# Patient Record
Sex: Female | Born: 1990 | Race: Black or African American | Hispanic: No | Marital: Single | Smoking: Never smoker
Health system: Southern US, Community
[De-identification: ages and names within clinical notes are randomized; demographics above are authoritative.]

## PROBLEM LIST (undated history)

## (undated) ENCOUNTER — Inpatient Hospital Stay: Payer: Self-pay

## (undated) DIAGNOSIS — D649 Anemia, unspecified: Secondary | ICD-10-CM

## (undated) DIAGNOSIS — Q780 Osteogenesis imperfecta: Secondary | ICD-10-CM

## (undated) DIAGNOSIS — D259 Leiomyoma of uterus, unspecified: Secondary | ICD-10-CM

## (undated) DIAGNOSIS — S42302A Unspecified fracture of shaft of humerus, left arm, initial encounter for closed fracture: Secondary | ICD-10-CM

## (undated) DIAGNOSIS — D509 Iron deficiency anemia, unspecified: Secondary | ICD-10-CM

## (undated) DIAGNOSIS — I10 Essential (primary) hypertension: Secondary | ICD-10-CM

## (undated) HISTORY — PX: OTHER SURGICAL HISTORY: SHX169

---
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
## 2011-05-12 ENCOUNTER — Emergency Department: Payer: Self-pay | Admitting: Emergency Medicine

## 2011-05-23 ENCOUNTER — Emergency Department: Payer: Self-pay | Admitting: Emergency Medicine

## 2011-12-24 ENCOUNTER — Emergency Department (HOSPITAL_COMMUNITY)
Admission: EM | Admit: 2011-12-24 | Discharge: 2011-12-24 | Disposition: A | Discharge status: Home / Self Care | Payer: Self-pay | Attending: Emergency Medicine | Admitting: Emergency Medicine

## 2011-12-24 ENCOUNTER — Encounter (HOSPITAL_COMMUNITY): Payer: Self-pay | Admitting: *Deleted

## 2011-12-24 DIAGNOSIS — N72 Inflammatory disease of cervix uteri: Secondary | ICD-10-CM | POA: Insufficient documentation

## 2011-12-24 DIAGNOSIS — N949 Unspecified condition associated with female genital organs and menstrual cycle: Secondary | ICD-10-CM | POA: Insufficient documentation

## 2011-12-24 DIAGNOSIS — N938 Other specified abnormal uterine and vaginal bleeding: Secondary | ICD-10-CM | POA: Insufficient documentation

## 2011-12-24 DIAGNOSIS — N898 Other specified noninflammatory disorders of vagina: Secondary | ICD-10-CM | POA: Insufficient documentation

## 2011-12-24 DIAGNOSIS — R1032 Left lower quadrant pain: Secondary | ICD-10-CM | POA: Insufficient documentation

## 2011-12-24 HISTORY — DX: Osteogenesis imperfecta: Q78.0

## 2011-12-24 HISTORY — DX: Anemia, unspecified: D64.9

## 2011-12-24 LAB — URINALYSIS, ROUTINE W REFLEX MICROSCOPIC
Ketones, ur: NEGATIVE mg/dL
Protein, ur: NEGATIVE mg/dL
Urobilinogen, UA: 1 mg/dL (ref 0.0–1.0)

## 2011-12-24 LAB — WET PREP, GENITAL
Clue Cells Wet Prep HPF POC: NONE SEEN
Trich, Wet Prep: NONE SEEN
Yeast Wet Prep HPF POC: NONE SEEN

## 2011-12-24 LAB — URINE MICROSCOPIC-ADD ON

## 2011-12-24 LAB — PREGNANCY, URINE: Preg Test, Ur: NEGATIVE

## 2011-12-24 MED ORDER — CEFTRIAXONE SODIUM 250 MG IJ SOLR
250.0000 mg | Freq: Once | INTRAMUSCULAR | Status: AC
  Administered 2011-12-24: 250 mg via INTRAMUSCULAR
  Filled 2011-12-24: qty 250

## 2011-12-24 MED ORDER — AZITHROMYCIN 250 MG PO TABS
1000.0000 mg | ORAL_TABLET | Freq: Every day | ORAL | Status: DC
  Administered 2011-12-24: 1000 mg via ORAL
  Filled 2011-12-24: qty 4

## 2011-12-24 NOTE — ED Provider Notes (Signed)
Medical screening examination/treatment/procedure(s) were performed by non-physician practitioner and as supervising physician I was immediately available for consultation/collaboration.  Gerhard Munch, MD 12/24/11 2333

## 2011-12-24 NOTE — Discharge Instructions (Signed)
There is a chance you may have a vaginal/cervical infection. You were treated for this infection today. Make sure to avoid intercourse for at least 5 days. You will be called with results of the cultures, and if abnormal, make sure your partner is treated as well. Follow up with primary care doctor or gynecology for recheck in 1 week to make sure you are improving or if continue to bleed.   Abnormal Uterine Bleeding Abnormal uterine bleeding can have many causes. Some cases are simply treated, while others are more serious. There are several kinds of bleeding that is considered abnormal, including:  Bleeding between periods.   Bleeding after sexual intercourse.   Spotting anytime in the menstrual cycle.   Bleeding heavier or more than normal.   Bleeding after menopause.  CAUSES  There are many causes of abnormal uterine bleeding. It can be present in teenagers, pregnant women, women during their reproductive years, and women who have reached menopause. Your caregiver will look for the more common causes depending on your age, signs, symptoms and your particular circumstance. Most cases are not serious and can be treated. Even the more serious causes, like cancer of the female organs, can be treated adequately if found in the early stages. That is why all types of bleeding should be evaluated and treated as soon as possible. DIAGNOSIS  Diagnosing the cause may take several kinds of tests. Your caregiver may:  Take a complete history of the type of bleeding.   Perform a complete physical exam and Pap smear.   Take an ultrasound on the abdomen showing a picture of the female organs and the pelvis.   Inject dye into the uterus and Fallopian tubes and X-ray them (hysterosalpingogram).   Place fluid in the uterus and do an ultrasound (sonohysterogrqphy).   Take a CT scan to examine the female organs and pelvis.   Take an MRI to examine the female organs and pelvis. There is no X-ray involved  with this procedure.   Look inside the uterus with a telescope that has a light at the end (hysteroscopy).   Scrap the inside of the uterus to get tissue to examine (Dilatation and Curettage, D&C).   Look into the pelvis with a telescope that has a light at the end (laparoscopy). This is done through a very small cut (incision) in the abdomen.  TREATMENT  Treatment will depend on the cause of the abnormal bleeding. It can include:  Doing nothing to allow the problem to take care of itself over time.   Hormone treatment.   Birth control pills.   Treating the medical condition causing the problem.   Laparoscopy.   Major or minor surgery   Destroying the lining of the uterus with electrical currant, laser, freezing or heat (uterine ablation).  HOME CARE INSTRUCTIONS   Follow your caregiver's recommendation on how to treat your problem.   See your caregiver if you missed a menstrual period and think you may be pregnant.   If you are bleeding heavily, count the number of pads/tampons you use and how often you have to change them. Tell this to your caregiver.   Avoid sexual intercourse until the problem is controlled.  SEEK MEDICAL CARE IF:   You have any kind of abnormal bleeding mentioned above.   You feel dizzy at times.   You are 21 years old and have not had a menstrual period yet.  SEEK IMMEDIATE MEDICAL CARE IF:   You pass out.  You are changing pads/tampons every 15 to 30 minutes.   You have belly (abdominal) pain.   You have a temperature of 100 F (37.8 C) or higher.   You become sweaty or weak.   You are passing large blood clots from the vagina.   You start to feel sick to your stomach (nauseous) and throw up (vomit).  Document Released: 08/16/2005 Document Revised: 08/05/2011 Document Reviewed: 01/09/2009 [facility] Patient Information 2012 [place], [place].

## 2011-12-24 NOTE — ED Provider Notes (Signed)
History     CSN: [id number]  Arrival date & time 12/24/11  1647   First MD Initiated Contact with Patient 12/24/11 1747      Chief Complaint  Patient presents with  . Vaginal Bleeding    (Consider location/radiation/quality/duration/timing/severity/associated sxs/prior treatment) Patient is a 21 y.o. female presenting with vaginal bleeding. The history is provided by the patient.  Vaginal Bleeding This is a new problem. The current episode started 1 to 4 weeks ago. The problem occurs constantly. The problem has been unchanged. Pertinent negatives include no abdominal pain, chills, fever, nausea, vomiting or weakness.  PT states she started her period 2 wks ago, at her normal time. States she normally has periods monthly and it lasts about 3 days, states this time she is still on it. States at times it is milder then at others.  States mild discomfort to the left lower abdomen, but states "it does not hurt." Denies dysuria, denies fever, chills, malaise. Pt is sexually actove.   Past Medical History  Diagnosis Date  . Anemia   . Osteogenesis imperfecta     Type I    History reviewed. No pertinent past surgical history.  No family history on file.  History  Substance Use Topics  . Smoking status: Never Smoker   . Smokeless tobacco: Not on file  . Alcohol Use: Yes     socially    OB History    Grav Para Term Preterm Abortions TAB SAB Ect Mult Living                  Review of Systems  Constitutional: Negative for fever and chills.  Respiratory: Negative.   Cardiovascular: Negative.   Gastrointestinal: Negative for nausea, vomiting and abdominal pain.  Genitourinary: Positive for vaginal bleeding. Negative for dysuria, urgency, hematuria, flank pain, vaginal pain and pelvic pain.  Musculoskeletal: Negative for back pain.  Skin: Negative.   Neurological: Negative for dizziness, weakness and light-headedness.    Allergies  Review of patient's allergies indicates no  known allergies.  Home Medications  No current outpatient prescriptions on file.  BP 133/81  Pulse 103  Temp(Src) 99.6 F (37.6 C) (Oral)  Resp 18  Wt 125 lb (56.7 kg)  SpO2 100%  LMP 12/10/2011  Physical Exam  Nursing note and vitals reviewed. Constitutional: She is oriented to person, place, and time. She appears well-developed and well-nourished. No distress.  HENT:  Head: Normocephalic.  Eyes:       Blue sclera  Neck: Neck supple.  Cardiovascular: Normal rate and regular rhythm.   Pulmonary/Chest: Effort normal and breath sounds normal. No respiratory distress. She has no wheezes. She has no rales.  Abdominal: Soft. Bowel sounds are normal. She exhibits no distension. There is no tenderness.  Genitourinary:       Yellow cervical discharge, CMT, normal uterus and adnexa with no tenderness. Normal external genetalia  Musculoskeletal: Normal range of motion.  Neurological: She is alert and oriented to person, place, and time.  Skin: Skin is warm and dry.  Psychiatric: She has a normal mood and affect.    ED Course  Procedures (including critical care time)  Pt with lower abdominal discofort, bleeding for 2 wks. Will get UA, pelvic exam.   Results for orders placed during the hospital encounter of 12/24/11  URINALYSIS, ROUTINE W REFLEX MICROSCOPIC      Component Value Range   Color, Urine YELLOW  YELLOW    APPearance CLOUDY (*) CLEAR    Specific  Gravity, Urine 1.018  1.005 - 1.030    pH 7.0  5.0 - 8.0    Glucose, UA NEGATIVE  NEGATIVE (mg/dL)   Hgb urine dipstick NEGATIVE  NEGATIVE    Bilirubin Urine NEGATIVE  NEGATIVE    Ketones, ur NEGATIVE  NEGATIVE (mg/dL)   Protein, ur NEGATIVE  NEGATIVE (mg/dL)   Urobilinogen, UA 1.0  0.0 - 1.0 (mg/dL)   Nitrite NEGATIVE  NEGATIVE    Leukocytes, UA SMALL (*) NEGATIVE   PREGNANCY, URINE      Component Value Range   Preg Test, Ur NEGATIVE  NEGATIVE   WET PREP, GENITAL      Component Value Range   Yeast Wet Prep HPF POC  NONE SEEN  NONE SEEN    Trich, Wet Prep NONE SEEN  NONE SEEN    Clue Cells Wet Prep HPF POC NONE SEEN  NONE SEEN    WBC, Wet Prep HPF POC MANY (*) NONE SEEN   URINE MICROSCOPIC-ADD ON      Component Value Range   Squamous Epithelial / LPF RARE  RARE    WBC, UA 7-10  <3 (WBC/hpf)   Bacteria, UA RARE  RARE    8:34 PM Many WBCs on wet prep. Will cover with cervicitis given exam findings. Rocephin and zithromax ordered. Will d/c home with follow up with GYN. No bleeding on exam. VS normal. Pt stable for discharge.      1. Cervicitis   2. Dysfunctional uterine bleeding       MDM          Lottie Mussel, PA 12/24/11 2035

## 2011-12-24 NOTE — ED Notes (Signed)
Pt states "sometimes my period has been heavy, sometimes not, has been like really strong smelling, has been going on for 2 wks, my stomach also hurts" pt indicates on the left side

## 2011-12-27 LAB — GC/CHLAMYDIA PROBE AMP, GENITAL
Chlamydia, DNA Probe: POSITIVE — AB
GC Probe Amp, Genital: POSITIVE — AB

## 2012-01-04 NOTE — ED Notes (Signed)
Pt returned call from letter she received and identity was verified x 2.  Pt then given results and instructions.  Pt was treated appropriately in ED so no further treatment needed.

## 2012-03-06 ENCOUNTER — Other Ambulatory Visit: Payer: Self-pay | Admitting: Orthopedic Surgery

## 2012-05-29 ENCOUNTER — Emergency Department: Payer: Self-pay | Admitting: Emergency Medicine

## 2013-08-29 IMAGING — CR LEFT MIDDLE FINGER 2+V
1 series · 3 of 3 positions shown · non-contrast
Comparison: none

REASON FOR EXAM: PAIN, H/O OSTEOGENESIS IMPERFECTA
COMMENTS:   May transport without cardiac monitor

[Series 1: x finger pa left · 0.14mm/px · 3 of 3 slices shown]
[im 1/3]
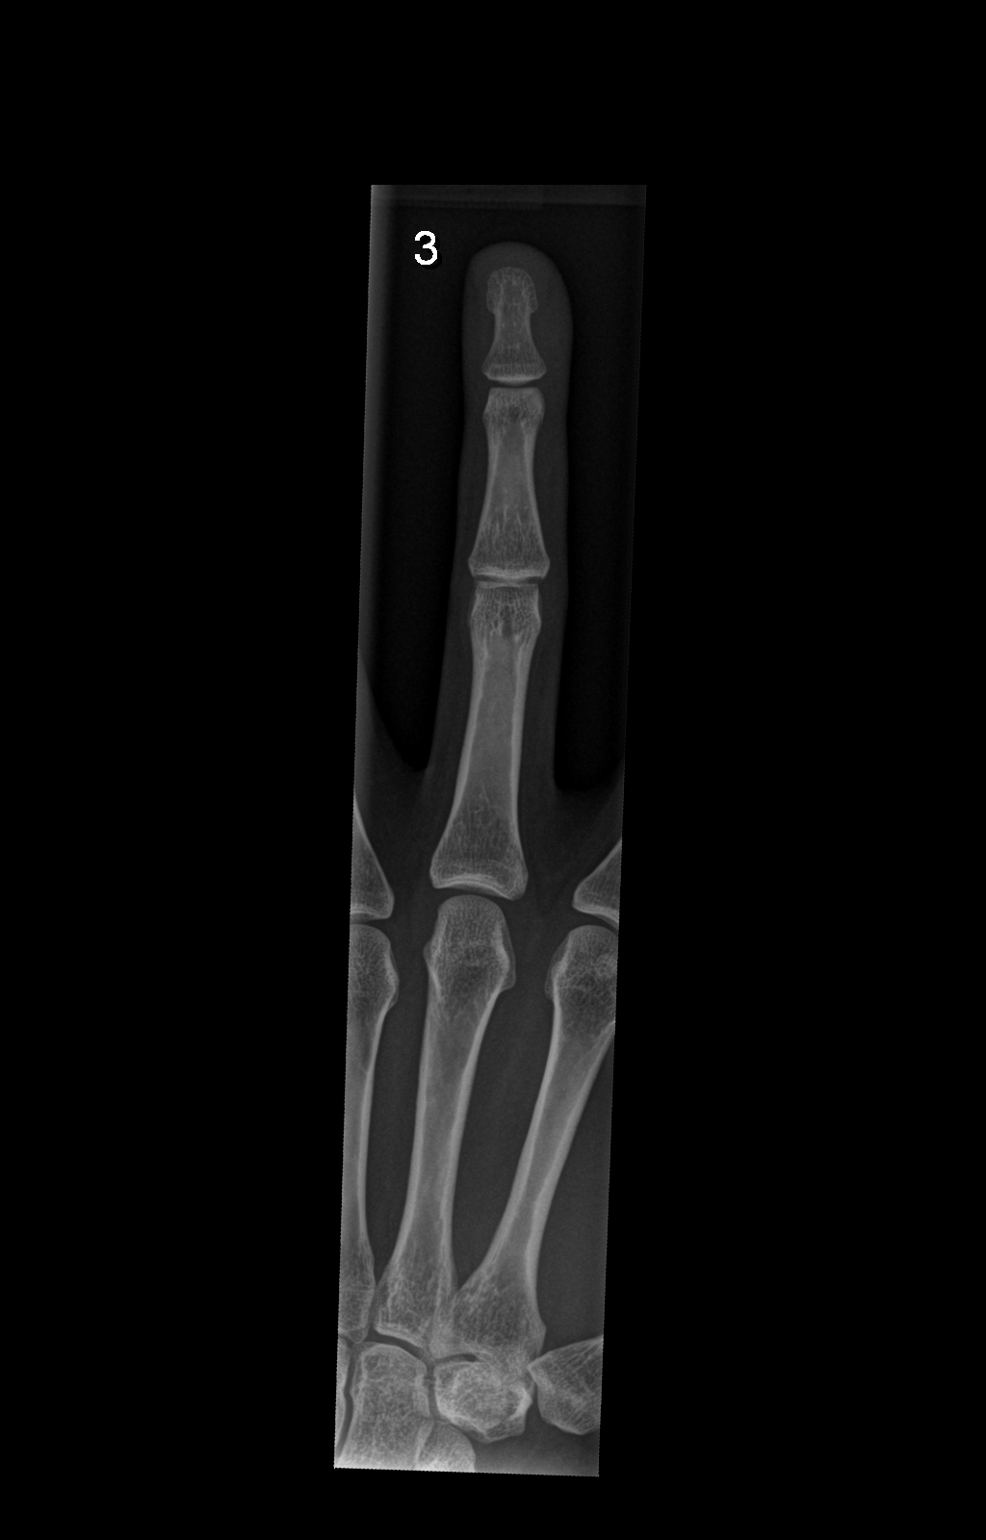
[im 2/3]
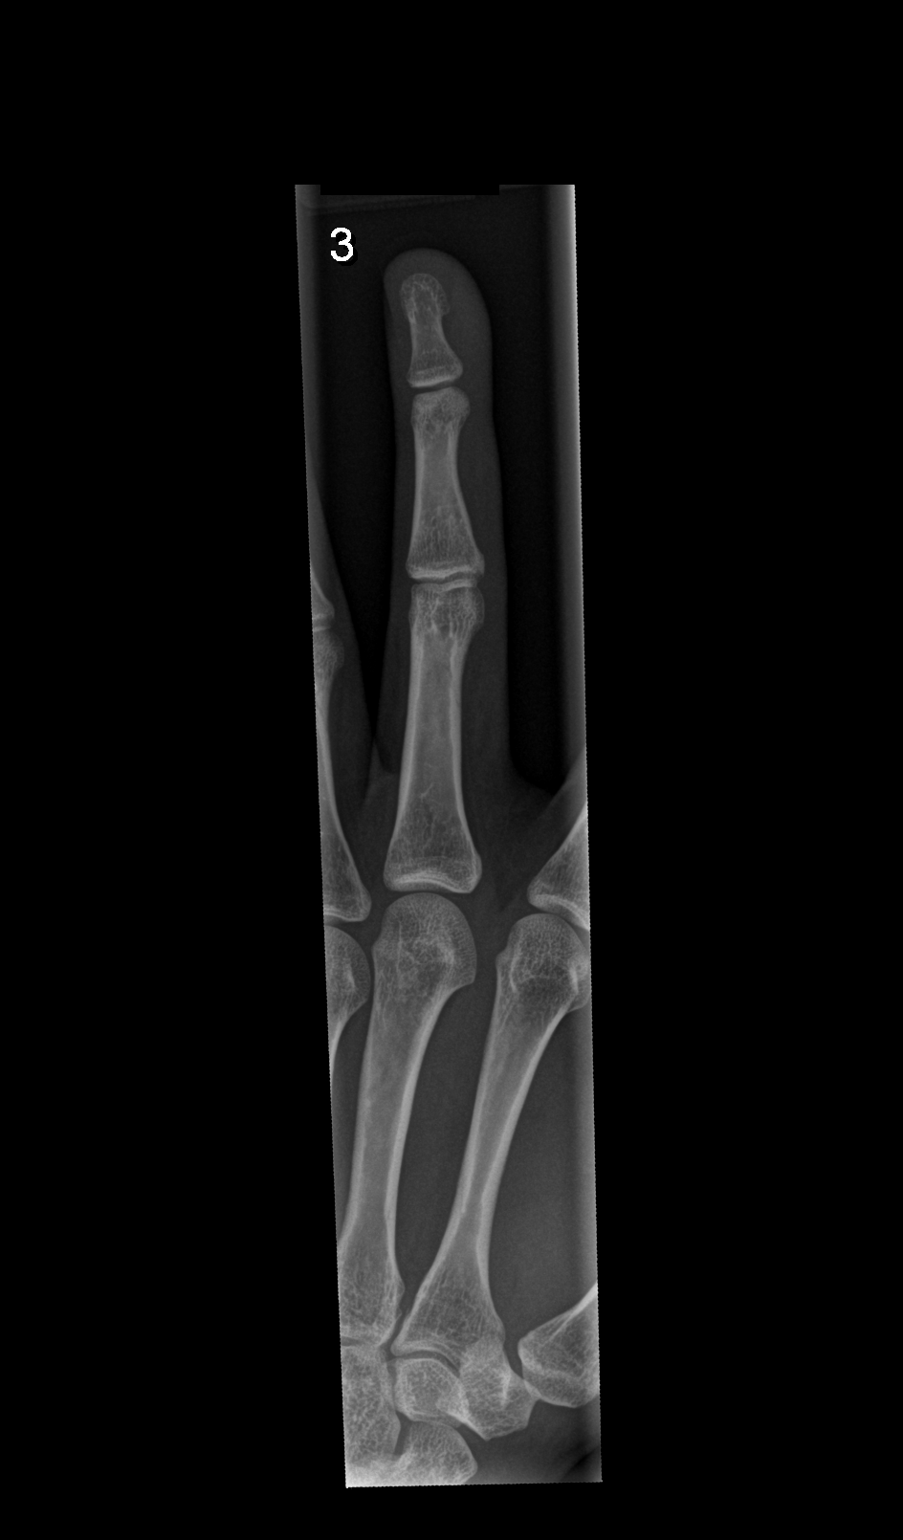
[im 3/3]
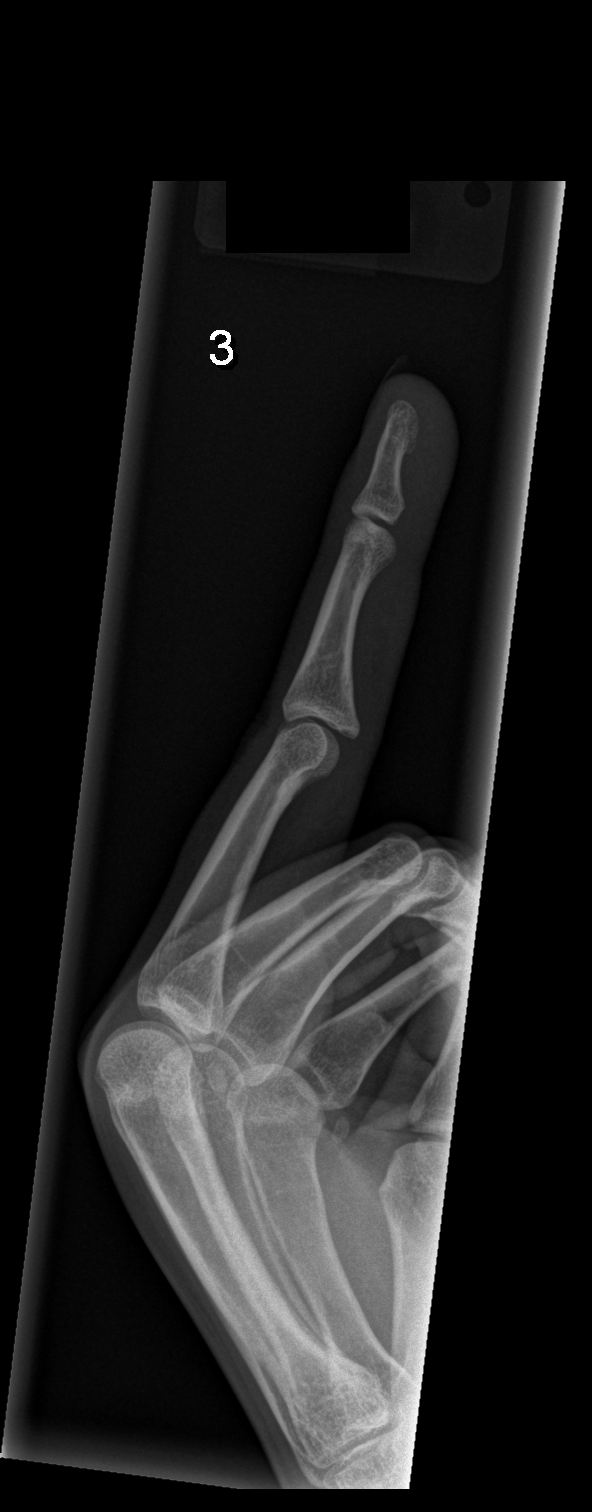

[3 of 3 positions shown; findings below may reference images not displayed]

PROCEDURE:     DXR - DXR FINGER MID 3RD DIGIT LT HAND  - May 29, 2012  [DATE]

RESULT:     Three views of the right index finger are submitted. The bones
are adequately mineralized. There is no evidence of an acute fracture or
dislocation. There is no periosteal reaction or lytic or blastic lesion. The
interphalangeal joints and the metacarpal phalangeal joint appear normal.
IMPRESSION: There is no acute bony abnormality of the left third or
long finger.

[REDACTED]

## 2013-12-23 ENCOUNTER — Emergency Department: Payer: Self-pay | Admitting: Emergency Medicine

## 2013-12-23 LAB — COMPREHENSIVE METABOLIC PANEL
ALBUMIN: 3.9 g/dL (ref 3.4–5.0)
ALK PHOS: 78 U/L
ALT: 16 U/L (ref 12–78)
ANION GAP: 6 — AB (ref 7–16)
AST: 27 U/L (ref 15–37)
BILIRUBIN TOTAL: 0.3 mg/dL (ref 0.2–1.0)
BUN: 9 mg/dL (ref 7–18)
CALCIUM: 8.9 mg/dL (ref 8.5–10.1)
Chloride: 106 mmol/L (ref 98–107)
Co2: 27 mmol/L (ref 21–32)
Creatinine: 0.55 mg/dL — ABNORMAL LOW (ref 0.60–1.30)
EGFR (African American): >60
Glucose: 83 mg/dL (ref 65–99)
Osmolality: 275 (ref 275–301)
POTASSIUM: 3.6 mmol/L (ref 3.5–5.1)
Sodium: 139 mmol/L (ref 136–145)
TOTAL PROTEIN: 8.5 g/dL — AB (ref 6.4–8.2)

## 2013-12-23 LAB — CBC WITH DIFFERENTIAL/PLATELET
Basophil #: 0.1 10*3/uL (ref 0.0–0.1)
Basophil %: 0.9 %
Eosinophil #: 0 10*3/uL (ref 0.0–0.7)
Eosinophil %: 0.3 %
HCT: 35.3 % (ref 35.0–47.0)
HGB: 10.8 g/dL — ABNORMAL LOW (ref 12.0–16.0)
LYMPHS ABS: 2.4 10*3/uL (ref 1.0–3.6)
Lymphocyte %: 32 %
MCH: 22.2 pg — AB (ref 26.0–34.0)
MCHC: 30.5 g/dL — AB (ref 32.0–36.0)
MCV: 73 fL — ABNORMAL LOW (ref 80–100)
MONO ABS: 0.4 x10 3/mm (ref 0.2–0.9)
MONOS PCT: 5.3 %
Neutrophil #: 4.6 10*3/uL (ref 1.4–6.5)
Neutrophil %: 61.5 %
Platelet: 294 10*3/uL (ref 150–440)
RBC: 4.84 10*6/uL (ref 3.80–5.20)
RDW: 17.8 % — AB (ref 11.5–14.5)
WBC: 7.5 10*3/uL (ref 3.6–11.0)

## 2013-12-23 LAB — URINALYSIS, COMPLETE
BILIRUBIN, UR: NEGATIVE
BLOOD: NEGATIVE
GLUCOSE, UR: NEGATIVE mg/dL (ref 0–75)
Ketone: NEGATIVE
Nitrite: NEGATIVE
PH: 5 (ref 4.5–8.0)
PROTEIN: NEGATIVE
Specific Gravity: 1.02 (ref 1.003–1.030)

## 2014-02-03 ENCOUNTER — Emergency Department: Payer: Self-pay | Admitting: Emergency Medicine

## 2014-02-03 LAB — GC/CHLAMYDIA PROBE AMP

## 2014-02-03 LAB — URINALYSIS, COMPLETE
BILIRUBIN, UR: NEGATIVE
Blood: NEGATIVE
GLUCOSE, UR: NEGATIVE mg/dL (ref 0–75)
Ketone: NEGATIVE
Nitrite: NEGATIVE
Ph: 6 (ref 4.5–8.0)
Protein: NEGATIVE
RBC,UR: 3 /HPF (ref 0–5)
Specific Gravity: 1.026 (ref 1.003–1.030)
Squamous Epithelial: 1

## 2014-02-03 LAB — WET PREP, GENITAL

## 2014-02-22 ENCOUNTER — Emergency Department: Payer: Self-pay | Admitting: Emergency Medicine

## 2014-02-22 LAB — URINALYSIS, COMPLETE
BILIRUBIN, UR: NEGATIVE
BLOOD: NEGATIVE
GLUCOSE, UR: NEGATIVE mg/dL (ref 0–75)
Hyaline Cast: 3
KETONE: NEGATIVE
Nitrite: NEGATIVE
PH: 6 (ref 4.5–8.0)
Protein: NEGATIVE
RBC,UR: 5 /HPF (ref 0–5)
Specific Gravity: 1.025 (ref 1.003–1.030)
Squamous Epithelial: 9

## 2014-02-22 LAB — GC/CHLAMYDIA PROBE AMP

## 2014-02-22 LAB — COMPREHENSIVE METABOLIC PANEL
ALK PHOS: 70 U/L
ALT: 14 U/L (ref 12–78)
ANION GAP: 6 — AB (ref 7–16)
Albumin: 3.8 g/dL (ref 3.4–5.0)
BUN: 8 mg/dL (ref 7–18)
Bilirubin,Total: 0.2 mg/dL (ref 0.2–1.0)
CALCIUM: 8.7 mg/dL (ref 8.5–10.1)
CHLORIDE: 108 mmol/L — AB (ref 98–107)
CO2: 25 mmol/L (ref 21–32)
Creatinine: 0.55 mg/dL — ABNORMAL LOW (ref 0.60–1.30)
EGFR (African American): >60
EGFR (Non-African Amer.): >60
GLUCOSE: 91 mg/dL (ref 65–99)
OSMOLALITY: 275 (ref 275–301)
Potassium: 3.5 mmol/L (ref 3.5–5.1)
SGOT(AST): 15 U/L (ref 15–37)
Sodium: 139 mmol/L (ref 136–145)
Total Protein: 7.8 g/dL (ref 6.4–8.2)

## 2014-02-22 LAB — CBC
HCT: 29 % — ABNORMAL LOW (ref 35.0–47.0)
HGB: 8.7 g/dL — ABNORMAL LOW (ref 12.0–16.0)
MCH: 20.5 pg — ABNORMAL LOW (ref 26.0–34.0)
MCHC: 30.1 g/dL — AB (ref 32.0–36.0)
MCV: 68 fL — ABNORMAL LOW (ref 80–100)
PLATELETS: 286 10*3/uL (ref 150–440)
RBC: 4.26 10*6/uL (ref 3.80–5.20)
RDW: 16.6 % — ABNORMAL HIGH (ref 11.5–14.5)
WBC: 7.4 10*3/uL (ref 3.6–11.0)

## 2014-02-22 LAB — WET PREP, GENITAL

## 2014-04-03 ENCOUNTER — Emergency Department: Payer: Self-pay | Admitting: Student

## 2014-04-03 LAB — COMPREHENSIVE METABOLIC PANEL
ALK PHOS: 76 U/L
ALT: 29 U/L
AST: 38 U/L — AB (ref 15–37)
Albumin: 3.9 g/dL (ref 3.4–5.0)
Anion Gap: 8 (ref 7–16)
BUN: 5 mg/dL — AB (ref 7–18)
Bilirubin,Total: 0.4 mg/dL (ref 0.2–1.0)
CALCIUM: 8.6 mg/dL (ref 8.5–10.1)
CREATININE: 0.67 mg/dL (ref 0.60–1.30)
Chloride: 106 mmol/L (ref 98–107)
Co2: 22 mmol/L (ref 21–32)
EGFR (Non-African Amer.): >60
Glucose: 91 mg/dL (ref 65–99)
OSMOLALITY: 269 (ref 275–301)
POTASSIUM: 3.7 mmol/L (ref 3.5–5.1)
SODIUM: 136 mmol/L (ref 136–145)
Total Protein: 8.7 g/dL — ABNORMAL HIGH (ref 6.4–8.2)

## 2014-04-03 LAB — CBC WITH DIFFERENTIAL/PLATELET
BASOS ABS: 0 10*3/uL (ref 0.0–0.1)
BASOS PCT: 0.7 %
Eosinophil #: 0 10*3/uL (ref 0.0–0.7)
Eosinophil %: 0 %
HCT: 28.1 % — ABNORMAL LOW (ref 35.0–47.0)
HGB: 8 g/dL — ABNORMAL LOW (ref 12.0–16.0)
Lymphocyte #: 0.4 10*3/uL — ABNORMAL LOW (ref 1.0–3.6)
Lymphocyte %: 7 %
MCH: 19.2 pg — AB (ref 26.0–34.0)
MCHC: 28.6 g/dL — ABNORMAL LOW (ref 32.0–36.0)
MCV: 67 fL — ABNORMAL LOW (ref 80–100)
Monocyte #: 0.7 x10 3/mm (ref 0.2–0.9)
Monocyte %: 13.2 %
NEUTROS PCT: 79.1 %
Neutrophil #: 4.3 10*3/uL (ref 1.4–6.5)
PLATELETS: 282 10*3/uL (ref 150–440)
RBC: 4.19 10*6/uL (ref 3.80–5.20)
RDW: 16.8 % — AB (ref 11.5–14.5)
WBC: 5.4 10*3/uL (ref 3.6–11.0)

## 2014-04-03 LAB — URINALYSIS, COMPLETE
BACTERIA: NONE SEEN
BILIRUBIN, UR: NEGATIVE
BLOOD: NEGATIVE
Glucose,UR: NEGATIVE mg/dL (ref 0–75)
Leukocyte Esterase: NEGATIVE
Nitrite: NEGATIVE
PH: 6 (ref 4.5–8.0)
Protein: NEGATIVE
Specific Gravity: 1.009 (ref 1.003–1.030)

## 2014-04-03 LAB — PREGNANCY, URINE: PREGNANCY TEST, URINE: NEGATIVE m[IU]/mL

## 2014-04-03 LAB — LIPASE, BLOOD: Lipase: 82 U/L (ref 73–393)

## 2014-08-30 DIAGNOSIS — O139 Gestational [pregnancy-induced] hypertension without significant proteinuria, unspecified trimester: Secondary | ICD-10-CM

## 2014-08-30 DIAGNOSIS — S42302A Unspecified fracture of shaft of humerus, left arm, initial encounter for closed fracture: Secondary | ICD-10-CM

## 2014-08-30 HISTORY — DX: Unspecified fracture of shaft of humerus, left arm, initial encounter for closed fracture: S42.302A

## 2014-08-30 HISTORY — PX: FOREARM FRACTURE SURGERY: SHX649

## 2014-08-30 HISTORY — DX: Gestational (pregnancy-induced) hypertension without significant proteinuria, unspecified trimester: O13.9

## 2015-01-24 ENCOUNTER — Emergency Department
Admission: EM | Admit: 2015-01-24 | Discharge: 2015-01-25 | Disposition: A | Discharge status: Home / Self Care | Payer: Medicaid Other | Attending: Emergency Medicine | Admitting: Emergency Medicine

## 2015-01-24 ENCOUNTER — Encounter: Payer: Self-pay | Admitting: Emergency Medicine

## 2015-01-24 ENCOUNTER — Emergency Department: Payer: Medicaid Other

## 2015-01-24 DIAGNOSIS — Z3A01 Less than 8 weeks gestation of pregnancy: Secondary | ICD-10-CM | POA: Insufficient documentation

## 2015-01-24 DIAGNOSIS — Z3491 Encounter for supervision of normal pregnancy, unspecified, first trimester: Secondary | ICD-10-CM

## 2015-01-24 DIAGNOSIS — R102 Pelvic and perineal pain: Secondary | ICD-10-CM | POA: Diagnosis not present

## 2015-01-24 DIAGNOSIS — O9989 Other specified diseases and conditions complicating pregnancy, childbirth and the puerperium: Secondary | ICD-10-CM | POA: Diagnosis not present

## 2015-01-24 LAB — COMPREHENSIVE METABOLIC PANEL
ALT: 13 U/L — AB (ref 14–54)
AST: 24 U/L (ref 15–41)
Albumin: 4.5 g/dL (ref 3.5–5.0)
Alkaline Phosphatase: 79 U/L (ref 38–126)
Anion gap: 8 (ref 5–15)
BILIRUBIN TOTAL: 0.5 mg/dL (ref 0.3–1.2)
BUN: 6 mg/dL (ref 6–20)
CHLORIDE: 103 mmol/L (ref 101–111)
CO2: 23 mmol/L (ref 22–32)
Calcium: 9 mg/dL (ref 8.9–10.3)
Creatinine, Ser: 0.57 mg/dL (ref 0.44–1.00)
GFR calc Af Amer: >60 mL/min (ref >60)
GFR calc non Af Amer: >60 mL/min (ref >60)
Glucose, Bld: 89 mg/dL (ref 65–99)
Potassium: 3.7 mmol/L (ref 3.5–5.1)
SODIUM: 134 mmol/L — AB (ref 135–145)
Total Protein: 8.3 g/dL — ABNORMAL HIGH (ref 6.5–8.1)

## 2015-01-24 LAB — URINALYSIS COMPLETE WITH MICROSCOPIC (ARMC ONLY)
BILIRUBIN URINE: NEGATIVE
GLUCOSE, UA: NEGATIVE mg/dL
Hgb urine dipstick: NEGATIVE
Nitrite: NEGATIVE
PROTEIN: NEGATIVE mg/dL
SPECIFIC GRAVITY, URINE: 1.015 (ref 1.005–1.030)
pH: 5 (ref 5.0–8.0)

## 2015-01-24 LAB — CBC WITH DIFFERENTIAL/PLATELET
BASOS ABS: 0.1 10*3/uL (ref 0–0.1)
Basophils Relative: 1 %
EOS ABS: 0 10*3/uL (ref 0–0.7)
Eosinophils Relative: 0 %
HEMATOCRIT: 30.2 % — AB (ref 35.0–47.0)
HEMOGLOBIN: 9 g/dL — AB (ref 12.0–16.0)
Lymphocytes Relative: 11 %
Lymphs Abs: 0.9 10*3/uL — ABNORMAL LOW (ref 1.0–3.6)
MCH: 19.9 pg — ABNORMAL LOW (ref 26.0–34.0)
MCHC: 29.8 g/dL — AB (ref 32.0–36.0)
MCV: 66.7 fL — ABNORMAL LOW (ref 80.0–100.0)
Monocytes Absolute: 1 10*3/uL — ABNORMAL HIGH (ref 0.2–0.9)
Monocytes Relative: 11 %
Neutro Abs: 6.8 10*3/uL — ABNORMAL HIGH (ref 1.4–6.5)
Platelets: 366 10*3/uL (ref 150–440)
RBC: 4.53 MIL/uL (ref 3.80–5.20)
RDW: 19.8 % — ABNORMAL HIGH (ref 11.5–14.5)
WBC: 8.9 10*3/uL (ref 3.6–11.0)

## 2015-01-24 LAB — LIPASE, BLOOD: LIPASE: 33 U/L (ref 22–51)

## 2015-01-24 LAB — POCT PREGNANCY, URINE: Preg Test, Ur: POSITIVE — AB

## 2015-01-24 NOTE — ED Notes (Signed)
Pt is ambulatory without difficulty to triage 1 with c/o back and left side pain since last night. Pt also reports pelvic pain which has been going on for months. Pt reports that she was seen by her doctor earlier in the week and was told that she is pregnant and that the pregnancy is high risk r/t pt's "bone disorder and low hemoglobin." Pt reports that she is about five weeks and three days pregnant. Pt is awake and alert during triage.

## 2015-01-24 NOTE — ED Provider Notes (Signed)
[facility] Emergency Department Provider Note  ____________________________________________  Time seen: Approximately 11:49 PM  I have reviewed the triage vital signs and the nursing notes.   HISTORY  Chief Complaint Flank Pain and Pelvic Pain    HPI Charlene Daniel is a 24 y.o. female is been having approximately one month of achy pain across her lower pelvis, and also into her right flank off-and-on. She notices this mostly at night, doesn't bother her during the day. She also notes that she found out she was pregnant about one week ago. She estimates she is about 5-[redacted] weeks pregnant based on her last period. She is under the care of the health department, who have referred her to high risk OB at [facility] because of her osteogenesis imperfecta  Pain is described as occasional, achy, intermittent, across the lower pelvis. This is associated with being pregnant. She denies any vaginal discharge, vaginal or uterine pain, and there has been no vaginal bleeding or spotting.  Past Medical History  Diagnosis Date  . Anemia   . Osteogenesis imperfecta     Type I    There are no active problems to display for this patient.  osteogenesis imperfecta  History reviewed. No pertinent past surgical history.  No current outpatient prescriptions on file.  Allergies Review of patient's allergies indicates no known allergies.  No family history on file.  Social History History  Substance Use Topics  . Smoking status: Never Smoker   . Smokeless tobacco: Not on file  . Alcohol Use: Yes     Comment: socially   no longer uses alcohol, no drug use  Review of Systems Constitutional: No fever/chills Eyes: No visual changes. ENT: No sore throat. Cardiovascular: Denies chest pain. Respiratory: Denies shortness of breath. Gastrointestinal: No abdominal pain.  No nausea, no vomiting.  No diarrhea.  No constipation. Genitourinary: Negative for dysuria. No blood in urine.  No vaginal bleeding. Musculoskeletal: See history of present illness Skin: Negative for rash. Neurological: Negative for headaches, focal weakness or numbness.  10-point ROS otherwise negative.  ____________________________________________   PHYSICAL EXAM:  VITAL SIGNS: ED Triage Vitals  Enc Vitals Group     BP 01/24/15 1907 135/76 mmHg     Pulse Rate 01/24/15 1907 102     Resp 01/24/15 1907 18     Temp 01/24/15 1907 98.6 F (37 C)     Temp Source 01/24/15 1907 Oral     SpO2 01/24/15 1907 98 %     Weight 01/24/15 1907 117 lb (53.071 kg)     Height 01/24/15 1907  (1.575 m)     Head Cir --      Peak Flow --      Pain Score 01/24/15 1907 5     Pain Loc --      Pain Edu? --      Excl. in GC? --     Constitutional: Alert and oriented. Well appearing and in no acute distress. Eyes: Conjunctivae are normal. PERRL. EOMI. Head: Atraumatic. Nose: No congestion/rhinnorhea. Mouth/Throat: Mucous membranes are moist.  Oropharynx non-erythematous. Neck: No stridor.   Cardiovascular: Normal rate, regular rhythm. Grossly normal heart sounds.  Good peripheral circulation. Respiratory: Normal respiratory effort.  No retractions. Lungs CTAB. Gastrointestinal: Soft and nontender. No distention. No abdominal bruits. No CVA tenderness. No rebound or guarding. Musculoskeletal: No lower extremity tenderness nor edema.  No joint effusions. Neurologic:  Normal speech and language. No gross focal neurologic deficits are appreciated. Speech is normal.  No gait instability. Skin:  Skin is warm, dry and intact. No rash noted. Psychiatric: Mood and affect are normal. Speech and behavior are normal.  ____________________________________________   LABS (all labs ordered are listed, but only abnormal results are displayed)  Labs Reviewed  CBC WITH DIFFERENTIAL/PLATELET - Abnormal; Notable for the following:    Hemoglobin 9.0 (*)    HCT 30.2 (*)    MCV 66.7 (*)    MCH 19.9 (*)    MCHC 29.8  (*)    RDW 19.8 (*)    Neutro Abs 6.8 (*)    Lymphs Abs 0.9 (*)    Monocytes Absolute 1.0 (*)    All other components within normal limits  COMPREHENSIVE METABOLIC PANEL - Abnormal; Notable for the following:    Sodium 134 (*)    Total Protein 8.3 (*)    ALT 13 (*)    All other components within normal limits  URINALYSIS COMPLETEWITH MICROSCOPIC ([facility] ONLY) - Abnormal; Notable for the following:    Color, Urine YELLOW (*)    APPearance CLEAR (*)    Ketones, ur 1+ (*)    Leukocytes, UA 2+ (*)    Bacteria, UA RARE (*)    Squamous Epithelial / LPF 0-5 (*)    All other components within normal limits  POCT PREGNANCY, URINE - Abnormal; Notable for the following:    Preg Test, Ur POSITIVE (*)    All other components within normal limits  LIPASE, BLOOD  HCG, QUANTITATIVE, PREGNANCY  POC URINE PREG, ED   ____________________________________________  EKG   ____________________________________________  RADIOLOGY   ____________________________________________   PROCEDURES  Procedure(s) performed: None  Critical Care performed: No  ____________________________________________   INITIAL IMPRESSION / ASSESSMENT AND PLAN / ED COURSE  Pertinent labs & imaging results that were available during my care of the patient were reviewed by me and considered in my medical decision making (see chart for details).  Patient presents with approximately one month of lower pelvic pain as well as some flank pain mostly at night. She has pregnant. She does not have associated fevers, bleeding, or other concerns. She is a very reassuring exam.  Ultrasound shows a expected 6 week IUP. There is no evidence of torsion or ovarian cystic mass. There is no reproducible tenderness on abdominal exam.  At this time, I do not find any acute emergent cause for her pain, I suspect this is likely related to early pregnancy. I [facility] who is making arrangements  for her to follow up with [facility]'s high risk OB.  Return precautions and plan discussed with patient and her boyfriend.  ____________________________________________   FINAL CLINICAL IMPRESSION(S) / ED DIAGNOSES  Final diagnoses:  First trimester pregnancy      Sharyn CreamerMark Kristen Bushway, MD 01/25/15 0007

## 2015-01-25 MED ORDER — ACETAMINOPHEN 500 MG PO TABS: 1000.0000 mg | ORAL_TABLET | ORAL | Status: DC

## 2015-01-25 NOTE — Discharge Instructions (Signed)
First Trimester of Pregnancy  Please follow-up with the [facility] on Monday. I recommend to you obtain consultation from 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  Return to the ER right away if he develop vaginal bleeding, fever, severe worsening pain, become weak or lightheaded, developed severe cramps, or other new concerns arise.  The first trimester of pregnancy is from week 1 until the end of week 12 (months 1 through 3). A week after a sperm fertilizes an egg, the egg will implant on the wall of the uterus. This embryo will begin to develop into a baby. Genes from you and your partner are forming the baby. The female genes determine whether the baby is a boy or a girl. At 6-8 weeks, the eyes and face are formed, and the heartbeat can be seen on ultrasound. At the end of 12 weeks, all the baby's organs are formed.  Now that you are pregnant, you will want to do everything you can to have a healthy baby. Two of the most important things are to get good prenatal care and to follow your health care provider's instructions. Prenatal care is all the medical care you receive before the baby's birth. This care will help prevent, find, and treat any problems during the pregnancy and childbirth. BODY CHANGES Your body goes through many changes during pregnancy. The changes vary from woman to woman.   You may gain or lose a couple of pounds at first.  You may feel sick to your stomach (nauseous) and throw up (vomit). If the vomiting is uncontrollable, call your health care provider.  You may tire easily.  You may develop headaches that can be relieved by medicines approved by your health care provider.  You may urinate more often. Painful urination may mean you have a bladder infection.  You may develop heartburn as a result of your pregnancy.  You may develop constipation because certain hormones are causing the muscles that push waste  through your intestines to slow down.  You may develop hemorrhoids or swollen, bulging veins (varicose veins).  Your breasts may begin to grow larger and become tender. Your nipples may stick out more, and the tissue that surrounds them (areola) may become darker.  Your gums may bleed and may be sensitive to brushing and flossing.  Dark spots or blotches (chloasma, mask of pregnancy) may develop on your face. This will likely fade after the baby is born.  Your menstrual periods will stop.  You may have a loss of appetite.  You may develop cravings for certain kinds of food.  You may have changes in your emotions from day to day, such as being excited to be pregnant or being concerned that something may go wrong with the pregnancy and baby.  You may have more vivid and strange dreams.  You may have changes in your hair. These can include thickening of your hair, rapid growth, and changes in texture. Some women also have hair loss during or after pregnancy, or hair that feels dry or thin. Your hair will most likely return to normal after your baby is born. WHAT TO EXPECT AT YOUR PRENATAL VISITS During a routine prenatal visit:  You will be weighed to make sure you and the baby are growing normally.  Your blood pressure will be taken.  Your abdomen will be measured to track your baby's growth.  The fetal heartbeat will be listened to starting around week 10 or  12 of your pregnancy.  Test results from any previous visits will be discussed. Your health care provider may ask you:  How you are feeling.  If you are feeling the baby move.  If you have had any abnormal symptoms, such as leaking fluid, bleeding, severe headaches, or abdominal cramping.  If you have any questions. Other tests that may be performed during your first trimester include:  Blood tests to find your blood type and to check for the presence of any previous infections. They will also be used to check for low  iron levels (anemia) and Rh antibodies. Later in the pregnancy, blood tests for diabetes will be done along with other tests if problems develop.  Urine tests to check for infections, diabetes, or protein in the urine.  An ultrasound to confirm the proper growth and development of the baby.  An amniocentesis to check for possible genetic problems.  Fetal screens for spina bifida and Down syndrome.  You may need other tests to make sure you and the baby are doing well. HOME CARE INSTRUCTIONS  Medicines  Follow your health care provider's instructions regarding medicine use. Specific medicines may be either safe or unsafe to take during pregnancy.  Take your prenatal vitamins as directed.  If you develop constipation, try taking a stool softener if your health care provider approves. Diet  Eat regular, well-balanced meals. Choose a variety of foods, such as meat or vegetable-based protein, fish, milk and low-fat dairy products, vegetables, fruits, and whole grain breads and cereals. Your health care provider will help you determine the amount of weight gain that is right for you.  Avoid raw meat and uncooked cheese. These carry germs that can cause birth defects in the baby.  Eating four or five small meals rather than three large meals a day may help relieve nausea and vomiting. If you start to feel nauseous, eating a few soda crackers can be helpful. Drinking liquids between meals instead of during meals also seems to help nausea and vomiting.  If you develop constipation, eat more high-fiber foods, such as fresh vegetables or fruit and whole grains. Drink enough fluids to keep your urine clear or pale yellow. Activity and Exercise  Exercise only as directed by your health care provider. Exercising will help you:  Control your weight.  Stay in shape.  Be prepared for labor and delivery.  Experiencing pain or cramping in the lower abdomen or low back is a good sign that you  should stop exercising. Check with your health care provider before continuing normal exercises.  Try to avoid standing for long periods of time. Move your legs often if you must stand in one place for a long time.  Avoid heavy lifting.  Wear low-heeled shoes, and practice good posture.  You may continue to have sex unless your health care provider directs you otherwise. Relief of Pain or Discomfort  Wear a good support bra for breast tenderness.   Take warm sitz baths to soothe any pain or discomfort caused by hemorrhoids. Use hemorrhoid cream if your health care provider approves.   Rest with your legs elevated if you have leg cramps or low back pain.  If you develop varicose veins in your legs, wear support hose. Elevate your feet for 15 minutes, 3-4 times a day. Limit salt in your diet. Prenatal Care  Schedule your prenatal visits by the twelfth week of pregnancy. They are usually scheduled monthly at first, then more often in the last 2  months before delivery.  Write down your questions. Take them to your prenatal visits.  Keep all your prenatal visits as directed by your health care provider. Safety  Wear your seat belt at all times when driving.  Make a list of emergency phone numbers, including numbers for family, friends, the hospital, and police and fire departments. General Tips  Ask your health care provider for a referral to a local prenatal education class. Begin classes no later than at the beginning of month 6 of your pregnancy.  Ask for help if you have counseling or nutritional needs during pregnancy. Your health care provider can offer advice or refer you to specialists for help with various needs.  Do not use hot tubs, steam rooms, or saunas.  Do not douche or use tampons or scented sanitary pads.  Do not cross your legs for long periods of time.  Avoid cat litter boxes and soil used by cats. These carry germs that can cause birth defects in the baby  and possibly loss of the fetus by miscarriage or stillbirth.  Avoid all smoking, herbs, alcohol, and medicines not prescribed by your health care provider. Chemicals in these affect the formation and growth of the baby.  Schedule a dentist appointment. At home, brush your teeth with a soft toothbrush and be gentle when you floss. SEEK MEDICAL CARE IF:   You have dizziness.  You have mild pelvic cramps, pelvic pressure, or nagging pain in the abdominal area.  You have persistent nausea, vomiting, or diarrhea.  You have a bad smelling vaginal discharge.  You have pain with urination.  You notice increased swelling in your face, hands, legs, or ankles. SEEK IMMEDIATE MEDICAL CARE IF:   You have a fever.  You are leaking fluid from your vagina.  You have spotting or bleeding from your vagina.  You have severe abdominal cramping or pain.  You have rapid weight gain or loss.  You vomit blood or material that looks like coffee grounds.  You are exposed to [place] measles and have never had them.  You are exposed to fifth disease or chickenpox.  You develop a severe headache.  You have shortness of breath.  You have any kind of trauma, such as from a fall or a car accident. Document Released: 08/10/2001 Document Revised: 12/31/2013 Document Reviewed: 06/26/2013 [facility] Patient Information 2015 [place], [place]. This information is not intended to replace advice given to you by your health care provider. Make sure you discuss any questions you have with your health care provider.

## 2015-03-02 ENCOUNTER — Encounter: Payer: Self-pay | Admitting: Emergency Medicine

## 2015-03-02 ENCOUNTER — Other Ambulatory Visit: Payer: Self-pay

## 2015-03-02 DIAGNOSIS — Z3A08 8 weeks gestation of pregnancy: Secondary | ICD-10-CM | POA: Insufficient documentation

## 2015-03-02 DIAGNOSIS — R079 Chest pain, unspecified: Secondary | ICD-10-CM | POA: Insufficient documentation

## 2015-03-02 DIAGNOSIS — J029 Acute pharyngitis, unspecified: Secondary | ICD-10-CM | POA: Insufficient documentation

## 2015-03-02 DIAGNOSIS — O9989 Other specified diseases and conditions complicating pregnancy, childbirth and the puerperium: Secondary | ICD-10-CM | POA: Insufficient documentation

## 2015-03-02 DIAGNOSIS — O99511 Diseases of the respiratory system complicating pregnancy, first trimester: Secondary | ICD-10-CM | POA: Diagnosis not present

## 2015-03-02 DIAGNOSIS — O210 Mild hyperemesis gravidarum: Secondary | ICD-10-CM | POA: Insufficient documentation

## 2015-03-02 LAB — BASIC METABOLIC PANEL
ANION GAP: 8 (ref 5–15)
BUN: 9 mg/dL (ref 6–20)
CO2: 24 mmol/L (ref 22–32)
Calcium: 9.1 mg/dL (ref 8.9–10.3)
Chloride: 102 mmol/L (ref 101–111)
Creatinine, Ser: 0.43 mg/dL — ABNORMAL LOW (ref 0.44–1.00)
GFR calc non Af Amer: >60 mL/min (ref >60)
Glucose, Bld: 99 mg/dL (ref 65–99)
Potassium: 3.9 mmol/L (ref 3.5–5.1)
Sodium: 134 mmol/L — ABNORMAL LOW (ref 135–145)

## 2015-03-02 LAB — CBC WITH DIFFERENTIAL/PLATELET
BASOS ABS: 0.1 10*3/uL (ref 0–0.1)
BASOS PCT: 1 %
Eosinophils Absolute: 0.1 10*3/uL (ref 0–0.7)
Eosinophils Relative: 1 %
HEMATOCRIT: 33.5 % — AB (ref 35.0–47.0)
Hemoglobin: 10.3 g/dL — ABNORMAL LOW (ref 12.0–16.0)
LYMPHS PCT: 14 %
Lymphs Abs: 1.9 10*3/uL (ref 1.0–3.6)
MCH: 22.7 pg — ABNORMAL LOW (ref 26.0–34.0)
MCHC: 30.9 g/dL — ABNORMAL LOW (ref 32.0–36.0)
MCV: 73.5 fL — ABNORMAL LOW (ref 80.0–100.0)
Monocytes Absolute: 0.8 10*3/uL (ref 0.2–0.9)
Monocytes Relative: 6 %
NEUTROS PCT: 78 %
Neutro Abs: 10.2 10*3/uL — ABNORMAL HIGH (ref 1.4–6.5)
Platelets: 335 10*3/uL (ref 150–440)
RBC: 4.56 MIL/uL (ref 3.80–5.20)
RDW: 24.9 % — ABNORMAL HIGH (ref 11.5–14.5)
WBC: 13 10*3/uL — ABNORMAL HIGH (ref 3.6–11.0)

## 2015-03-02 LAB — LIPASE, BLOOD: Lipase: 33 U/L (ref 22–51)

## 2015-03-02 NOTE — ED Notes (Addendum)
Pt ambulatory with steady gait; pt says around 2pm today she began having a hot sensation to her upper chest; pt says she is pregnant-nausea and vomiting today; LMP end of April; has first this Thursday-no prenatal care to date; noticed flecks of blood in vomit after the first episode; pt says since vomiting she has a sore throat; pt awake and alert; talking in complete coherent sentences

## 2015-03-03 ENCOUNTER — Emergency Department
Admission: EM | Admit: 2015-03-03 | Discharge: 2015-03-03 | Discharge status: Against Medical Advice | Payer: Medicaid Other | Attending: Emergency Medicine | Admitting: Emergency Medicine

## 2015-03-07 LAB — OB RESULTS CONSOLE GC/CHLAMYDIA: Gonorrhea: NEGATIVE

## 2015-03-31 DIAGNOSIS — S5292XA Unspecified fracture of left forearm, initial encounter for closed fracture: Secondary | ICD-10-CM

## 2015-03-31 HISTORY — DX: Unspecified fracture of left forearm, initial encounter for closed fracture: S52.92XA

## 2015-04-07 ENCOUNTER — Ambulatory Visit (HOSPITAL_BASED_OUTPATIENT_CLINIC_OR_DEPARTMENT_OTHER)
Admission: RE | Admit: 2015-04-07 | Discharge: 2015-04-07 | Disposition: A | Discharge status: Home / Self Care | Payer: Medicaid Other | Source: Ambulatory Visit | Attending: Obstetrics and Gynecology | Admitting: Obstetrics and Gynecology

## 2015-04-07 ENCOUNTER — Ambulatory Visit
Admission: RE | Admit: 2015-04-07 | Discharge: 2015-04-07 | Disposition: A | Discharge status: Home / Self Care | Payer: Medicaid Other | Source: Ambulatory Visit | Attending: Obstetrics and Gynecology | Admitting: Obstetrics and Gynecology

## 2015-04-07 VITALS — BP 127/80 | HR 75 | Temp 97.8°F | Wt 119.0 lb

## 2015-04-07 DIAGNOSIS — Z8489 Family history of other specified conditions: Secondary | ICD-10-CM

## 2015-04-07 DIAGNOSIS — Q780 Osteogenesis imperfecta: Secondary | ICD-10-CM | POA: Diagnosis not present

## 2015-04-07 DIAGNOSIS — Z3A16 16 weeks gestation of pregnancy: Secondary | ICD-10-CM

## 2015-04-07 NOTE — Progress Notes (Signed)
Referring Provider:  [facility] 50 minute consultation  Ms. 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  This note summarizes the information we discussed.    We obtained a detailed family history and pregnancy history.  This is the first pregnancy for this couple.  Ms. Burlison has had no complications or exposures in this pregnancy which would be expected to increase the chance for birth defects.    The family history is remarkable for what is consistent with type I Osteogenesis Imperfecta (OI), the dominanting inherited, non-deforming type with blue sclera.   Ms. Westra has had numerous fractures starting at less than 28 year old.  No new fractures have occurred since she was 24 years old. She is now 24 years old and in good health.  She is one of 6 full siblings, four of whom have also been diagnosed with OI.  Her father and three of her paternal half siblings have also been diagnosed with OI.  All of the affected individuals in the family have blue sclera, normal stature and varying numbers of fractures.  No hearing loss or dental anomalies were reported.  No individuals had fractures prior to or at the time of birth and our patient believes all were delivered vaginally.  The patient reported that she was the first one in the family to be tested for OI when she was a child, but she does not recall which hospital or when that was performed.  It would be helpful if we knew for sure the hospital and the dates when the family had genetic testing (if it was done) to clarify the specific diagnosis and possible DNA results.  If this is known, please contact our office at [phone].  The father of the baby has a four year old son who is in good health. He and his father have been diagnosed with bipolar and schizophrenia, but he is not taking medication  at this time.  Mental health conditions are known to have strong genetic factors in some families, though we do not yet fully understand the genetics of these conditions.  When a parent and other relatives are known to have a history of mental health conditions, there is expected to be an increased chance for a similar condition in other family members. The remainder of the family history is unremarkable for birth defects, developmental differences, recurrent pregnancy loss or known genetic conditions.  Osteogenesis Imperfecta (OI) is a genetic condition caused by a defect in the formation of collagen which may result in increased fractures, blue sclera, frequent hearing loss, short stature and in some cases dental abnormalities.  There are multiple types of OI with varying degrees of severity, from mild to those that are lethal in the newborn period.  The family history for Ms. Soltis is consistent with a dominant form of OI that has blue sclera and does not result in short stature (type I).  This means that she likely has a genetic change in the way collagen is formed on one copy of her gene.  The other copy of that gene is normal.  For each of her children, there is a 50/50 chance to pass on the normal or the changed copy, so each pregnancy is at 50% risk for having OI.  This is consistent with multiple affected generations and approximately half of her siblings having the condition.  Genetic testing is available to determine the specific mutation in this gene and therefore provide information about whether or not different family members may be affected and therefore at high risk for fractures. If the mutation responsible for OI in the family is known, then testing of an unborn baby is also available through amniocentesis. Without documentation of the mutation in the family, we would not offer invasive prenatal diagnosis. Because Ms. Nicks was not sure of when or where testing may have been performed, we  provided her with a medical record release form.  She may complete the form with the hospital and dates of testing and return it to [place] to allow a request to review her prior testing.  We suggested that a if no genetic studies have been done, evaluation by a Medical Genetics Doctor would be the best way to confirm the type in the family and offer appropriate testing options.  The couple is not interested in any prenatal diagnosis for this pregnancy for OI.  We did recommend a detailed anatomy ultrasound at [redacted] weeks gestation to assess for possible findings suggestive of fractures.  Please see the MFM Consultation note for additional recommendations regarding management of the pregnancy.   If they choose to have further evaluation, they could visit [facility].  The number for the [facility] is [phone].    Ms. Delagarza had prior first trimester screening at [facility] which was normal and should be scheduled for AFP testing there in the second trimester. Notes state that the patient is anemic, though no prenatal labs have been drawn at this time.  If the patient is anemic, we would recommend iron studies as well as a hemoglobin fractionation and CBC to rule out inherited forms of anemia.  If she were found to be a carrier for a hemoglobinopathy, we are happy to meet with her again.   We scheduled her detailed anatomy ultrasound here at [facility] to assess fetal anatomy at [redacted] weeks gestation.  If there were to be any evidence of fractures prenatally, delivery planning should be considered, though no family members have had prenatal fractures.  See MFM note for recommendations.  The patient was encouraged to call with questions or concerns.  We can be contacted at [phone].  Cherly Anderson, MS, CGC

## 2015-04-07 NOTE — Progress Notes (Signed)
Charlene Wells, MS, CGC performed an integral service incident to the physician's initial service.  I was physically present in the clinical area and was immediately available to render assistance.   Hensley Aziz C Kassem Kibbe  

## 2015-04-07 NOTE — Progress Notes (Signed)
Obstetrics Admission History & Physical  CC: Osteogenesis Imperfecta  HPI:  Charlene Daniel is a 24 y.o. 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  PMHx: Patient  has a past medical history of Anemia and Osteogenesis imperfecta. She states that between the age of 79 months and 8 or 24 years old, she has had between 10 and 20 fractures.   PSHx: She  has past surgical history that includes arm surgery. Medications: none Allergies: has No Known Allergies. OBHx:  OB History  Gravida Para Term Preterm AB SAB TAB Ectopic Multiple Living  1             # Outcome Date GA Lbr Len/2nd Weight Sex Delivery Anes PTL Lv  1 Current              GYNHx: History of abnormal pap smears: No.                History of STIs: Yes.  (had an equivocal chlamydia result on 03/17/2015)            FHx: Father and 5 of 10 siblings with OI.  PGF with colon cancer.    Soc Hx:  reports that she has never smoked. She has never used smokeless tobacco. She reports that she does not drink alcohol or use illicit drugs.     FOB is involved with the pregnancy  Objective:  LMP 12/20/2014 (Approximate) Temp:  [97.8 F (36.6 C)] 97.8 F (36.6 C) (08/08 0900) Pulse Rate:  [75] 75 (08/08 0900) BP: (127)/(80) 127/80 mmHg (08/08 0900) Weight:  [119 lb (53.978 kg)] 119 lb (53.978 kg) (08/08 0900)    Perinatal info:  Labs not available for review  Assessment & Plan:  Charlene Daniel is a 24 y.o. 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  Charlene Daniel had genetic counseling and a records release was obtained to try and get more information regarding any genetic testing that may have been performed in childhood.  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  Pregnancy is unlikely to impact her personal health although there is a 50% chance of the fetus being affected.    Pregnancy management should not be altered by the maternal diagnosis other than recommending a detailed ultrasound at about 18 weeks to look for the presence of fetal long bone fractures and in the third trimester (at least at 28 weeks and then every 4-6 weeks).  Operative vaginal delivery should be avoided unless the patient elects to have genetic testing performed on the fetus and the fetus is found to be unaffected.  Cesarean section has not been found to reduce the risk of fetal fractures and should be reserved for the usual indications.   Charlene Daniel has no orthopedic limitations that would prevent a vaginal delivery.  I recommend that the patient's 18 week anatomy scan be performed at [facility] and an order was placed.  Recommend consult with Neonatologist in preparation for delivery at [facility].  Lastly, both the patient and her partner are African-American.  Recommend hemoglobin electrophoresis (if not already performed) and MCV to evaluate for a hemoglobinopathy.  Charlene Daniel was encouraged to take prenatal  vitamins (she admits that she hasn't been).  Please don't hesitate to contact us with any questions.  Kirby Funk, MD

## 2015-04-12 ENCOUNTER — Encounter: Payer: Self-pay | Admitting: *Deleted

## 2015-04-12 ENCOUNTER — Emergency Department
Admission: EM | Admit: 2015-04-12 | Discharge: 2015-04-13 | Disposition: A | Discharge status: Home / Self Care | Payer: Medicaid Other | Attending: Student | Admitting: Student

## 2015-04-12 DIAGNOSIS — S5292XA Unspecified fracture of left forearm, initial encounter for closed fracture: Secondary | ICD-10-CM

## 2015-04-12 DIAGNOSIS — Y92009 Unspecified place in unspecified non-institutional (private) residence as the place of occurrence of the external cause: Secondary | ICD-10-CM | POA: Insufficient documentation

## 2015-04-12 DIAGNOSIS — S52322A Displaced transverse fracture of shaft of left radius, initial encounter for closed fracture: Secondary | ICD-10-CM | POA: Diagnosis not present

## 2015-04-12 DIAGNOSIS — Q780 Osteogenesis imperfecta: Secondary | ICD-10-CM | POA: Insufficient documentation

## 2015-04-12 DIAGNOSIS — Z79899 Other long term (current) drug therapy: Secondary | ICD-10-CM | POA: Insufficient documentation

## 2015-04-12 DIAGNOSIS — Z3A01 Less than 8 weeks gestation of pregnancy: Secondary | ICD-10-CM | POA: Diagnosis not present

## 2015-04-12 DIAGNOSIS — W010XXA Fall on same level from slipping, tripping and stumbling without subsequent striking against object, initial encounter: Secondary | ICD-10-CM | POA: Insufficient documentation

## 2015-04-12 DIAGNOSIS — Y9389 Activity, other specified: Secondary | ICD-10-CM | POA: Diagnosis not present

## 2015-04-12 DIAGNOSIS — Y998 Other external cause status: Secondary | ICD-10-CM | POA: Diagnosis not present

## 2015-04-12 DIAGNOSIS — M79632 Pain in left forearm: Secondary | ICD-10-CM

## 2015-04-12 DIAGNOSIS — O9A211 Injury, poisoning and certain other consequences of external causes complicating pregnancy, first trimester: Secondary | ICD-10-CM | POA: Diagnosis present

## 2015-04-12 DIAGNOSIS — W19XXXA Unspecified fall, initial encounter: Secondary | ICD-10-CM

## 2015-04-12 MED ORDER — SODIUM CHLORIDE 0.9 % IV BOLUS (SEPSIS)
500.0000 mL | Freq: Once | INTRAVENOUS | Status: AC
  Administered 2015-04-13: 500 mL via INTRAVENOUS

## 2015-04-12 MED ORDER — MORPHINE SULFATE 4 MG/ML IJ SOLN
4.0000 mg | Freq: Once | INTRAMUSCULAR | Status: AC
  Administered 2015-04-13: 4 mg via INTRAVENOUS
  Filled 2015-04-12: qty 1

## 2015-04-12 MED ORDER — ONDANSETRON HCL 4 MG/2ML IJ SOLN
4.0000 mg | Freq: Once | INTRAMUSCULAR | Status: AC
  Administered 2015-04-13: 4 mg via INTRAVENOUS
  Filled 2015-04-12: qty 2

## 2015-04-12 NOTE — ED Provider Notes (Addendum)
[facility] Emergency Department Provider Note  ____________________________________________  Time seen: Approximately 11:45 PM  I have reviewed the triage vital signs and the nursing notes.   HISTORY  Chief Complaint Arm Injury    HPI Charlene Daniel is a 24 y.o. female with history of anemia, type I osteogenesis imperfecta, G1 P0 at approximately 4 weeks estimated gestational age presents for evaluation of traumatic left arm pain. Patient reports that she and her boyfriend were playfully chasing each other around at the house when she slipped and fell onto the left arm just prior to arrival. Her pain has been constant, severe since onset. It is throbbing. She did not hit her head or lose consciousness. She did not fall onto her abdomen. She is not complaining of any abdominal pain, loss of fluid from her vagina, or vaginal bleeding. Movement worsens her left arm pain. She has otherwise been in her usual state of health.   Past Medical History  Diagnosis Date  . Anemia   . Osteogenesis imperfecta     Type I    Patient Active Problem List   Diagnosis Date Noted  . Osteogenesis imperfecta 04/07/2015    Past Surgical History  Procedure Laterality Date  . Arm surger      Current Outpatient Rx  Name  Route  Sig  Dispense  Refill  . ferrous fumarate (HEMOCYTE - 106 MG FE) 325 (106 FE) MG TABS tablet   Oral   Take 1 tablet by mouth 2 (two) times daily.         . Prenatal Vit-Fe Fumarate-FA (PRENATAL MULTIVITAMIN) TABS tablet   Oral   Take 1 tablet by mouth daily at 12 noon.           Allergies Review of patient's allergies indicates no known allergies.  History reviewed. No pertinent family history.  Social History Social History  Substance Use Topics  . Smoking status: Never Smoker   . Smokeless tobacco: Never Used  . Alcohol Use: No     Comment: socially    Review of Systems Constitutional: No fever/chills Eyes: No visual  changes. ENT: No sore throat. Cardiovascular: Denies chest pain. Respiratory: Denies shortness of breath. Gastrointestinal: No abdominal pain.  No nausea, no vomiting.  No diarrhea.  No constipation. Genitourinary: Negative for dysuria. Musculoskeletal: Negative for back pain. Skin: Negative for rash. Neurological: Negative for headaches, focal weakness or numbness.  10-point ROS otherwise negative.  ____________________________________________   PHYSICAL EXAM:  VITAL SIGNS: ED Triage Vitals  Enc Vitals Group     BP 04/12/15 2329 133/92 mmHg     Pulse Rate 04/12/15 2332 63     Resp 04/12/15 2329 16     Temp 04/12/15 2329 98.4 F (36.9 C)     Temp Source 04/12/15 2329 Oral     SpO2 04/12/15 2332 100 %     Weight 04/12/15 2329 118 lb (53.524 kg)     Height 04/12/15 2329 5\' 2"  (1.575 m)     Head Cir --      Peak Flow --      Pain Score 04/12/15 2330 7     Pain Loc --      Pain Edu? --      Excl. in GC? --     Constitutional: Alert and oriented. Tearful in mild distress due to pain Eyes: Conjunctivae are normal. PERRL. EOMI. Head: Atraumatic. Nose: No congestion/rhinnorhea. Mouth/Throat: Mucous membranes are moist.  Oropharynx non-erythematous. Neck: No stridor.  Cardiovascular:  Normal rate, regular rhythm. Grossly normal heart sounds.  Good peripheral circulation. Respiratory: Normal respiratory effort.  No retractions. Lungs CTAB. Gastrointestinal: Soft, nontender gravid uterus with fundus palpated above the pubic symphysis. No distention. No abdominal bruits. No CVA tenderness. Genitourinary: deferred Musculoskeletal: Left forearm with tenderness in the mid shaft and the distal forearm, slight bowing deformity, 2+ left radial pulse, wiggles the fingers, sensation intact. Neurologic:  Normal speech and language. No gross focal neurologic deficits are appreciated. No gait instability. Skin:  Skin is warm, dry and intact. No rash noted. Psychiatric: Mood and affect are  normal. Speech and behavior are normal.  ____________________________________________   LABS (all labs ordered are listed, but only abnormal results are displayed)  Labs Reviewed - No data to display ____________________________________________  EKG  none ____________________________________________  RADIOLOGY  Left forearm xray IMPRESSION: 1. Acute transverse fracture of the mid- distal shaft of the radius with 6 mm of radial displacement. 2. 4 mm radiopaque density within the superficial soft tissues of the mid forearm, indeterminate. Correlation with physical exam recommended. ____________________________________________   PROCEDURES  Procedure(s) performed: None  Critical Care performed: No  ____________________________________________   INITIAL IMPRESSION / ASSESSMENT AND PLAN / ED COURSE  Pertinent labs & imaging results that were available during my care of the patient were reviewed by me and considered in my medical decision making (see chart for details).  Charlene Daniel is a 24 y.o. female with history of anemia, type I osteogenesis imperfecta, G1 P0 at approximately 4 weeks estimated gestational age presents for evaluation of traumatic left arm pain. NV intact in the left arm with bony tenderness. No other injuries and exam otherwise atraumatic. We'll treat her pain and obtain plain films to evaluate for fracture or dislocation. Will get fetal heart tones.  ----------------------------------------- 2:17 AM on 04/13/2015 -----------------------------------------  Fetal heart tones 147 bpm. X-ray of the left forearm reveals fracture of the mid/distal radius. I discussed the case with Dr. Cloyde Reams of orthopedic surgery. He has reviewed the  Plain films. He recommends discharge with sugar tong splint and close orthopedic surgery follow-up as she will need a non-emergent operation. He will have the office contact her. Patient's pain is well-controlled at this time.  I reassessed her after sugar tong splint, she wiggles the fingers, she has brisk capillary refill. Discussed return precautions and need for close orthopedic surgery follow-up and she is comfortable with the discharge plan. ____________________________________________   FINAL CLINICAL IMPRESSION(S) / ED DIAGNOSES  Final diagnoses:  Pain of left forearm  Fall, initial encounter      Gayla Doss, MD 04/13/15 1610  Gayla Doss, MD 04/13/15 0221

## 2015-04-12 NOTE — ED Notes (Addendum)
Pt reports falling at home and landing on left arm. Pt arrives to ED with splint on arm. Pt reports having broken left arm 10 years ago. Pt has osteogenesis imperfecta. Pulses present, movement and sensation intact. Pt reports feeling hot a nauseous. Pt is 4 months pregnant. Pt denies falling on head or stomach.

## 2015-04-13 ENCOUNTER — Emergency Department: Payer: Medicaid Other

## 2015-04-13 MED ORDER — OXYCODONE HCL 5 MG PO TABS: 5.0000 mg | ORAL_TABLET | Freq: Four times a day (QID) | ORAL | Status: DC | PRN

## 2015-04-17 LAB — OB RESULTS CONSOLE HGB/HCT, BLOOD
HCT: 32 %
HEMOGLOBIN: 10.1 g/dL

## 2015-04-17 LAB — OB RESULTS CONSOLE PLATELET COUNT: Platelets: 332 10*3/uL

## 2015-04-17 LAB — OB RESULTS CONSOLE HEPATITIS B SURFACE ANTIGEN: HEP B S AG: NEGATIVE

## 2015-04-17 LAB — OB RESULTS CONSOLE RPR: RPR: NONREACTIVE

## 2015-04-17 LAB — OB RESULTS CONSOLE VARICELLA ZOSTER ANTIBODY, IGG: VARICELLA IGG: IMMUNE

## 2015-04-17 LAB — OB RESULTS CONSOLE GC/CHLAMYDIA
Chlamydia: NEGATIVE
Gonorrhea: NEGATIVE

## 2015-04-17 LAB — OB RESULTS CONSOLE HIV ANTIBODY (ROUTINE TESTING): HIV: NONREACTIVE

## 2015-04-17 LAB — OB RESULTS CONSOLE RUBELLA ANTIBODY, IGM: Rubella: IMMUNE

## 2015-04-21 ENCOUNTER — Ambulatory Visit
Admission: RE | Admit: 2015-04-21 | Discharge: 2015-04-21 | Disposition: A | Discharge status: Home / Self Care | Payer: Medicaid Other | Source: Ambulatory Visit | Attending: Maternal & Fetal Medicine | Admitting: Maternal & Fetal Medicine

## 2015-04-21 VITALS — BP 134/80 | HR 80 | Temp 98.1°F | Wt 121.0 lb

## 2015-04-21 DIAGNOSIS — Z1379 Encounter for other screening for genetic and chromosomal anomalies: Secondary | ICD-10-CM | POA: Diagnosis not present

## 2015-04-21 DIAGNOSIS — Q780 Osteogenesis imperfecta: Secondary | ICD-10-CM | POA: Insufficient documentation

## 2015-04-21 DIAGNOSIS — Z36 Encounter for antenatal screening of mother: Secondary | ICD-10-CM | POA: Diagnosis present

## 2015-04-21 HISTORY — DX: Unspecified fracture of shaft of humerus, left arm, initial encounter for closed fracture: S42.302A

## 2015-06-30 ENCOUNTER — Ambulatory Visit
Admission: RE | Admit: 2015-06-30 | Discharge: 2015-06-30 | Disposition: A | Discharge status: Home / Self Care | Payer: Medicaid Other | Source: Ambulatory Visit | Attending: Obstetrics and Gynecology | Admitting: Obstetrics and Gynecology

## 2015-06-30 ENCOUNTER — Other Ambulatory Visit: Payer: Self-pay | Admitting: Maternal & Fetal Medicine

## 2015-06-30 VITALS — BP 135/78 | HR 80 | Temp 97.8°F | Wt 134.0 lb

## 2015-06-30 DIAGNOSIS — O365930 Maternal care for other known or suspected poor fetal growth, third trimester, not applicable or unspecified: Secondary | ICD-10-CM | POA: Insufficient documentation

## 2015-06-30 DIAGNOSIS — Z3A28 28 weeks gestation of pregnancy: Secondary | ICD-10-CM | POA: Insufficient documentation

## 2015-06-30 DIAGNOSIS — O365931 Maternal care for other known or suspected poor fetal growth, third trimester, fetus 1: Secondary | ICD-10-CM

## 2015-06-30 DIAGNOSIS — Q780 Osteogenesis imperfecta: Secondary | ICD-10-CM

## 2015-07-07 ENCOUNTER — Ambulatory Visit
Admission: RE | Admit: 2015-07-07 | Discharge: 2015-07-07 | Disposition: A | Discharge status: Home / Self Care | Payer: Medicaid Other | Source: Ambulatory Visit | Attending: Maternal & Fetal Medicine | Admitting: Maternal & Fetal Medicine

## 2015-07-07 VITALS — BP 139/84 | HR 77 | Temp 97.5°F | Wt 135.0 lb

## 2015-07-07 DIAGNOSIS — Z3A29 29 weeks gestation of pregnancy: Secondary | ICD-10-CM | POA: Insufficient documentation

## 2015-07-07 DIAGNOSIS — O365931 Maternal care for other known or suspected poor fetal growth, third trimester, fetus 1: Secondary | ICD-10-CM

## 2015-07-07 DIAGNOSIS — O365930 Maternal care for other known or suspected poor fetal growth, third trimester, not applicable or unspecified: Secondary | ICD-10-CM | POA: Diagnosis present

## 2015-07-07 DIAGNOSIS — Q780 Osteogenesis imperfecta: Secondary | ICD-10-CM

## 2015-07-14 ENCOUNTER — Ambulatory Visit
Admission: RE | Admit: 2015-07-14 | Discharge: 2015-07-14 | Disposition: A | Discharge status: Home / Self Care | Payer: Medicaid Other | Source: Ambulatory Visit | Attending: Obstetrics and Gynecology | Admitting: Obstetrics and Gynecology

## 2015-07-14 VITALS — BP 122/84 | HR 88 | Temp 98.3°F | Wt 138.0 lb

## 2015-07-14 DIAGNOSIS — O365931 Maternal care for other known or suspected poor fetal growth, third trimester, fetus 1: Secondary | ICD-10-CM

## 2015-07-14 DIAGNOSIS — O365930 Maternal care for other known or suspected poor fetal growth, third trimester, not applicable or unspecified: Secondary | ICD-10-CM

## 2015-07-14 DIAGNOSIS — Q780 Osteogenesis imperfecta: Secondary | ICD-10-CM

## 2015-07-17 ENCOUNTER — Other Ambulatory Visit: Payer: Self-pay

## 2015-07-17 DIAGNOSIS — O365931 Maternal care for other known or suspected poor fetal growth, third trimester, fetus 1: Secondary | ICD-10-CM

## 2015-07-21 ENCOUNTER — Ambulatory Visit
Admission: RE | Admit: 2015-07-21 | Discharge: 2015-07-21 | Disposition: A | Discharge status: Home / Self Care | Payer: Medicaid Other | Source: Ambulatory Visit | Attending: Obstetrics and Gynecology | Admitting: Obstetrics and Gynecology

## 2015-07-21 ENCOUNTER — Observation Stay
Admission: RE | Admit: 2015-07-21 | Discharge: 2015-07-22 | Disposition: A | Discharge status: Other Acute Inpatient Hospital | Payer: Medicaid Other | Attending: Obstetrics & Gynecology | Admitting: Obstetrics & Gynecology

## 2015-07-21 ENCOUNTER — Encounter: Payer: Self-pay | Admitting: *Deleted

## 2015-07-21 VITALS — BP 145/94 | HR 86 | Temp 98.3°F | Wt 145.0 lb

## 2015-07-21 DIAGNOSIS — O479 False labor, unspecified: Secondary | ICD-10-CM | POA: Diagnosis present

## 2015-07-21 DIAGNOSIS — O365930 Maternal care for other known or suspected poor fetal growth, third trimester, not applicable or unspecified: Secondary | ICD-10-CM

## 2015-07-21 DIAGNOSIS — Z3A31 31 weeks gestation of pregnancy: Secondary | ICD-10-CM | POA: Insufficient documentation

## 2015-07-21 DIAGNOSIS — O365931 Maternal care for other known or suspected poor fetal growth, third trimester, fetus 1: Secondary | ICD-10-CM

## 2015-07-21 DIAGNOSIS — O1413 Severe pre-eclampsia, third trimester: Secondary | ICD-10-CM | POA: Diagnosis not present

## 2015-07-21 DIAGNOSIS — Q780 Osteogenesis imperfecta: Secondary | ICD-10-CM

## 2015-07-21 DIAGNOSIS — R030 Elevated blood-pressure reading, without diagnosis of hypertension: Secondary | ICD-10-CM | POA: Diagnosis present

## 2015-07-21 LAB — CBC
HCT: 31.1 % — ABNORMAL LOW (ref 35.0–47.0)
Hemoglobin: 9.7 g/dL — ABNORMAL LOW (ref 12.0–16.0)
MCH: 23.1 pg — AB (ref 26.0–34.0)
MCHC: 31.2 g/dL — AB (ref 32.0–36.0)
MCV: 74.1 fL — AB (ref 80.0–100.0)
PLATELETS: 245 10*3/uL (ref 150–440)
RBC: 4.2 MIL/uL (ref 3.80–5.20)
RDW: 15.6 % — AB (ref 11.5–14.5)
WBC: 13.5 10*3/uL — ABNORMAL HIGH (ref 3.6–11.0)

## 2015-07-21 LAB — COMPREHENSIVE METABOLIC PANEL
ALT: 50 U/L (ref 14–54)
ANION GAP: 5 (ref 5–15)
AST: 33 U/L (ref 15–41)
Albumin: 2.6 g/dL — ABNORMAL LOW (ref 3.5–5.0)
Alkaline Phosphatase: 109 U/L (ref 38–126)
BILIRUBIN TOTAL: 0.2 mg/dL — AB (ref 0.3–1.2)
BUN: 10 mg/dL (ref 6–20)
CALCIUM: 8.2 mg/dL — AB (ref 8.9–10.3)
CO2: 22 mmol/L (ref 22–32)
CREATININE: 0.48 mg/dL (ref 0.44–1.00)
Chloride: 101 mmol/L (ref 101–111)
GFR calc non Af Amer: >60 mL/min (ref >60)
GLUCOSE: 80 mg/dL (ref 65–99)
Potassium: 3.5 mmol/L (ref 3.5–5.1)
SODIUM: 128 mmol/L — AB (ref 135–145)
TOTAL PROTEIN: 6.9 g/dL (ref 6.5–8.1)

## 2015-07-21 LAB — URIC ACID: Uric Acid, Serum: 3.4 mg/dL (ref 2.3–6.6)

## 2015-07-21 LAB — PROTEIN / CREATININE RATIO, URINE
CREATININE, URINE: 108 mg/dL
Protein Creatinine Ratio: 0.27 mg/mg{Cre} — ABNORMAL HIGH (ref 0.00–0.15)
TOTAL PROTEIN, URINE: 29 mg/dL

## 2015-07-21 MED ORDER — SODIUM CHLORIDE 0.9 % IJ SOLN
INTRAMUSCULAR | Status: AC
  Filled 2015-07-21: qty 3

## 2015-07-21 MED ORDER — LABETALOL HCL 200 MG PO TABS
200.0000 mg | ORAL_TABLET | Freq: Two times a day (BID) | ORAL | Status: DC
Start: 2015-07-21 — End: 2015-07-22
  Administered 2015-07-21 – 2015-07-22 (×2): 200 mg via ORAL
  Filled 2015-07-21 (×2): qty 1

## 2015-07-21 MED ORDER — ONDANSETRON HCL 4 MG/2ML IJ SOLN: 4.0000 mg | Freq: Four times a day (QID) | INTRAMUSCULAR | Status: DC | PRN

## 2015-07-21 MED ORDER — BETAMETHASONE SOD PHOS & ACET 6 (3-3) MG/ML IJ SUSP
12.0000 mg | INTRAMUSCULAR | Status: DC
  Administered 2015-07-21: 12 mg via INTRAMUSCULAR
  Filled 2015-07-21: qty 2

## 2015-07-21 MED ORDER — LABETALOL HCL 5 MG/ML IV SOLN
10.0000 mg | INTRAVENOUS | Status: DC | PRN
  Administered 2015-07-21 (×3): 10 mg via INTRAVENOUS
  Filled 2015-07-21 (×2): qty 4

## 2015-07-21 MED ORDER — LACTATED RINGERS IV SOLN
INTRAVENOUS | Status: DC
  Administered 2015-07-21: 125 mL/h via INTRAVENOUS
  Administered 2015-07-22: 04:00:00 via INTRAVENOUS

## 2015-07-21 MED ORDER — ACETAMINOPHEN 325 MG PO TABS: 650.0000 mg | ORAL_TABLET | ORAL | Status: DC | PRN

## 2015-07-21 NOTE — OB Triage Note (Signed)
23yo G1P0 patient at 3324w1d gestation sent from [facility] for high blood pressure.

## 2015-07-21 NOTE — Progress Notes (Signed)
Dr Tiburcio PeaHarris stating that Ext Toco may be removed

## 2015-07-21 NOTE — H&P (Signed)
Obstetrics Admission History & Physical   Hypertension   HPI:  24 y.o. G1P0 @ 6054w1d (09/21/2015, by Ultrasound). Admitted on 07/21/2015:   Patient Active Problem List   Diagnosis Date Noted  . Irregular contractions 07/21/2015  . Poor fetal growth affecting management of mother in third trimester, antepartum 06/30/2015  . Osteogenesis imperfecta 04/07/2015    Presents for evaluation for HTN with elevated reading in office today.  Pt denies h/a, blurry vision, CP, SOB, edema, oliguria, epigatric or RUQ pain.  [facility] and evaluated with US for IUGR as well as Osteogenesis Imperfecta.  EFW is now 6%, and Dopplers were read as normal today  BPP 8/8.   [facility]  PMHx:  Past Medical History  Diagnosis Date  . Anemia   . Osteogenesis imperfecta     Type I  . Arm fracture, left 2016   PSHx:  Past Surgical History  Procedure Laterality Date  . Arm surger     Medications:  Prescriptions prior to admission  Medication Sig Dispense Refill Last Dose  . ferrous fumarate (HEMOCYTE - 106 MG FE) 325 (106 FE) MG TABS tablet Take 1 tablet by mouth 2 (two) times daily.   Unknown at Unknown time  . oxyCODONE (ROXICODONE) 5 MG immediate release tablet Take 1 tablet (5 mg total) by mouth every 6 (six) hours as needed for moderate pain. Do not drive while taking this medication. (Patient not taking: Reported on 04/21/2015) 12 tablet 0 Unknown at Unknown time  . Prenatal Vit-Fe Fumarate-FA (PRENATAL MULTIVITAMIN) TABS tablet Take 1 tablet by mouth daily at 12 noon.   Unknown at Unknown time  . vitamin B-6 (PYRIDOXINE) 25 MG tablet Take 25 mg by mouth daily.   Unknown at Unknown time   Allergies: has No Known Allergies. OBHx:  OB History  Gravida Para Term Preterm AB SAB TAB Ectopic Multiple Living  1             # Outcome Date GA Lbr Len/2nd Weight Sex Delivery Anes PTL Lv  1 Current              ZOX:[id number]:Negative/unremarkable except as detailed in  HPI. Soc Hx: Pregnancy welcomed  Objective:   Filed Vitals:   07/21/15 1849 07/21/15 1857  BP: 159/107 180/118  Pulse: 76 81  Temp:     General: Well nourished, well developed female in no acute distress.  Skin: Warm and dry.  Cardiovascular:Regular rate and rhythm. Respiratory: Clear to auscultation bilateral. Normal respiratory effort Abdomen: gravid, NT Neuro/Psych: Normal mood and affect.   EFM:FHR: 140 bpm, variability: moderate,  accelerations:  Present,  decelerations:  Absent Toco: None   Perinatal info:  Blood type: O+ Rubella- Immune Varicella -Immune TDaP (unsure) RPR NR / HIV Neg/ HBsAg Neg   Assessment & Plan:   24 y.o. G1P0 @ 8954w1d, Admitted on 07/21/2015: HYPERTENSION in THIRD TRIMESTER.  Plan evaluation for preclampsia.  No sx's c/w precalmpsia.    Labs Plan treatment of elevated BPs > 160 or 110.  Labetalol. BMZ for maturity as may need early delivery with both risk factor of IUGR and HTN here at 31 weeks. Continued visits for US monitoring of growth and Doppler blood flow. Risks of Osteogenesis Imperfecta and L&D aware and discussed.    Fetal Wellbeing Reassuring

## 2015-07-22 ENCOUNTER — Ambulatory Visit (HOSPITAL_COMMUNITY)
Admission: AD | Admit: 2015-07-22 | Discharge: 2015-07-22 | Disposition: A | Discharge status: Home / Self Care | Payer: Medicaid Other | Source: Other Acute Inpatient Hospital | Attending: Obstetrics and Gynecology | Admitting: Obstetrics and Gynecology

## 2015-07-22 DIAGNOSIS — Z3A31 31 weeks gestation of pregnancy: Secondary | ICD-10-CM | POA: Insufficient documentation

## 2015-07-22 DIAGNOSIS — O1493 Unspecified pre-eclampsia, third trimester: Secondary | ICD-10-CM | POA: Diagnosis present

## 2015-07-22 DIAGNOSIS — O1413 Severe pre-eclampsia, third trimester: Secondary | ICD-10-CM | POA: Diagnosis not present

## 2015-07-22 LAB — COMPREHENSIVE METABOLIC PANEL
ALBUMIN: 2.5 g/dL — AB (ref 3.5–5.0)
ALK PHOS: 118 U/L (ref 38–126)
ALT: 44 U/L (ref 14–54)
AST: 35 U/L (ref 15–41)
Anion gap: 6 (ref 5–15)
BUN: 11 mg/dL (ref 6–20)
CALCIUM: 8.8 mg/dL — AB (ref 8.9–10.3)
CO2: 22 mmol/L (ref 22–32)
CREATININE: 0.44 mg/dL (ref 0.44–1.00)
Chloride: 107 mmol/L (ref 101–111)
GFR calc Af Amer: >60 mL/min (ref >60)
GFR calc non Af Amer: >60 mL/min (ref >60)
GLUCOSE: 118 mg/dL — AB (ref 65–99)
Potassium: 3.9 mmol/L (ref 3.5–5.1)
SODIUM: 135 mmol/L (ref 135–145)
TOTAL PROTEIN: 6.2 g/dL — AB (ref 6.5–8.1)

## 2015-07-22 LAB — CBC
HCT: 29.7 % — ABNORMAL LOW (ref 35.0–47.0)
HEMOGLOBIN: 9.2 g/dL — AB (ref 12.0–16.0)
MCH: 22.8 pg — AB (ref 26.0–34.0)
MCHC: 30.8 g/dL — AB (ref 32.0–36.0)
MCV: 73.9 fL — ABNORMAL LOW (ref 80.0–100.0)
PLATELETS: 226 10*3/uL (ref 150–440)
RBC: 4.02 MIL/uL (ref 3.80–5.20)
RDW: 15.5 % — AB (ref 11.5–14.5)
WBC: 16.6 10*3/uL — ABNORMAL HIGH (ref 3.6–11.0)

## 2015-07-22 LAB — TYPE AND SCREEN
ABO/RH(D): O POS
Antibody Screen: NEGATIVE

## 2015-07-22 LAB — ABO/RH: ABO/RH(D): O POS

## 2015-07-22 MED ORDER — FAMOTIDINE 20 MG PO TABS
20.0000 mg | ORAL_TABLET | Freq: Two times a day (BID) | ORAL | Status: DC
  Administered 2015-07-22: 20 mg via ORAL
  Filled 2015-07-22: qty 1

## 2015-07-22 MED ORDER — LABETALOL HCL 200 MG PO TABS: 200.0000 mg | ORAL_TABLET | Freq: Two times a day (BID) | ORAL | Status: DC

## 2015-07-22 NOTE — Discharge Summary (Signed)
Discharge Summary   Admit Date: 07/21/2015 Discharge Date: 07/22/2015 Discharging Service: Obstetrics  Primary OBGYN: [facility] Admitting Physician: Nadara Mustard, MD  Discharge Physician: Pax Bing, Montez Hageman MD  Referring Provider: [facility] MFM  Admission Diagnoses: *Intrauterine pregnancy at 31/1 weeks *Elevated blood pressure *Chronic fetal growth restriction *Likely fetus with osteogenesis imperfecta *History of gonorrhea and equivocal chlamydia with negative test of cure  Discharge Diagnoses: *Intrauterine pregnancy at 31/2 weeks *Pre-eclampsia with severe features (based on blood pressures) *Same  Consult Orders: Phone consult to [facility]  Surgeries/Procedures Performed: None  History and Physical: Author: Nadara Mustard, MD Service: Obstetrics/Gynecology Author Type: Physician    Filed: 07/21/2015 7:20 PM Note Time: 07/21/2015 7:11 PM Status: Signed   Editor: Nadara Mustard, MD (Physician)     Expand All Collapse All   Obstetrics Admission History & Physical   Hypertension   HPI:  24 y.o. G1P0 @ [redacted]w[redacted]d (09/21/2015, by Ultrasound). Admitted on 07/21/2015:  Patient Active Problem List   Diagnosis Date Noted  . Irregular contractions 07/21/2015  . Poor fetal growth affecting management of mother in third trimester, antepartum 06/30/2015  . Osteogenesis imperfecta 04/07/2015    Presents for evaluation for HTN with elevated reading in office today. Pt denies h/a, blurry vision, CP, SOB, edema, oliguria, epigatric or RUQ pain. Seen in [facility] today and evaluated with [place] for IUGR as well as Osteogenesis Imperfecta. EFW is now 6%, and Dopplers were read as normal today BPP 8/8.  [facility] care at: at [facility]  PMHx:  Past Medical History  Diagnosis Date  . Anemia   . Osteogenesis imperfecta     Type I  . Arm fracture, left 2016   PSHx:  Past Surgical History  Procedure Laterality Date  .  Arm surger     Medications:  Prescriptions prior to admission  Medication Sig Dispense Refill Last Dose  . ferrous fumarate (HEMOCYTE - 106 MG FE) 325 (106 FE) MG TABS tablet Take 1 tablet by mouth 2 (two) times daily.   Unknown at Unknown time  . oxyCODONE (ROXICODONE) 5 MG immediate release tablet Take 1 tablet (5 mg total) by mouth every 6 (six) hours as needed for moderate pain. Do not drive while taking this medication. (Patient not taking: Reported on 04/21/2015) 12 tablet 0 Unknown at Unknown time  . Prenatal Vit-Fe Fumarate-FA (PRENATAL MULTIVITAMIN) TABS tablet Take 1 tablet by mouth daily at 12 noon.   Unknown at Unknown time  . vitamin B-6 (PYRIDOXINE) 25 MG tablet Take 25 mg by mouth daily.   Unknown at Unknown time   Allergies: has No Known Allergies. OBHx:  OB History  Gravida Para Term Preterm AB SAB TAB Ectopic Multiple Living  1             # Outcome Date GA Lbr Len/2nd Weight Sex Delivery Anes PTL Lv  1 Current              UJW:[id number] except as detailed in HPI. Soc Hx: Pregnancy welcomed  Objective:   Filed Vitals:   07/21/15 1849 07/21/15 1857  BP: 159/107 180/118  Pulse: 76 81  Temp:     General: Well nourished, well developed female in no acute distress.  Skin: Warm and dry.  Cardiovascular:Regular rate and rhythm. Respiratory: Clear to auscultation bilateral. Normal respiratory effort Abdomen: gravid, NT Neuro/Psych: Normal mood and affect.   EFM:FHR: 140 bpm, variability: moderate, accelerations: Present, decelerations: Absent Toco: None   Perinatal info:  Blood type:  O+ Rubella- Immune Varicella -Immune TDaP (unsure) RPR NR / HIV Neg/ HBsAg Neg   Assessment & Plan:   24 y.o. G1P0 @ [redacted]w[redacted]d, Admitted on 07/21/2015: HYPERTENSION in THIRD TRIMESTER.  Plan evaluation for preclampsia. No sx's c/w precalmpsia.  Labs Plan  treatment of elevated BPs > 160 or 110. Labetalol. BMZ for maturity as may need early delivery with both risk factor of IUGR and HTN here at 31 weeks. Continued visits for [place] monitoring of growth and Doppler blood flow. Risks of Osteogenesis Imperfecta and L&D aware and discussed.   Fetal Wellbeing Reassuring     Hospital Course: Patient seen by [facility] on 11/21 for 3wk rpt growth and had above u/s with BP 145/94 and patient sent to L&D, with above BPs noted. Patient received labetalol  IV x 3, with last dose at 1937 on 11/21 and started on labetalol  PO bid with first dose given on 2120 on 11/21. Patient asymptomatic aside from BPs with below negative labs (borderline PC ratio). She received BMZ #1 on 1915 on 11/21. Patient watched overnight with category I EFM and BPs back to normal to just mild range BPs.   *IUP: category I tracing with accels, fetal status reassuring -11/21: EFW 6%, 1397gm, AC <3%, AFI 15.1, S/D normal, no e/o fractures -10/31 @ 28/1: EFW 9%, EFW 103gm, AC 13%, AFI normal, normal dopplers *HTN: Given initial admit BPs and need for IV medications, concern that this meets the new criteria for pre-eclampsia with severe features based on BP criteria alone. I d/w this with [facility] and they agree that she likely has severe criteria but at the very least needs to be monitored as an inpatient until delivery. I d/w this with the patient and her partner and how quickly the situation can deteriorate and need for expedited delivery can occur and I believe that her and fetus (only 31wks and growth restricted) are better in a facility that can take care of both of them. They are amenable to transfer to [facility]. D/w MFM Dr. Idamae Schuller and since hasn't been on Mg, BPs better controlled and labs normal, and would've come off Mg in 12 hours, after 2nd BMZ dose, can hold off on starting it.  -Rpt HELLP labs now -continue EFM until transferred to [facility] -continue with bid  labetalol -Can saline lock IV since on regular diet already; pt told to not eat heavily -will start 24hr urine collection now *FGR: see above. Continue with qday NSTs, qwk dopplers and AFI, q3wk growth u/s *OI: fetus with likely OI; has 50% risk and although no e/o fractures, at 28wk u/s there was e/o cranial compression with fundal compression -green top tube at delivery -avoid OVD *Preterm -NICU consult -BMZ #2 due 11/22 @ 1915 *Analgesia: no needs *PPx: SCDs   Recent Labs Lab 07/21/15 1811  NA 128*  K 3.5  CL 101  CO2 22  BUN 10  CREATININE 0.48  CALCIUM 8.2*  PROT 6.9  BILITOT 0.2*  ALKPHOS 109  ALT 50  AST 33  GLUCOSE 80    Recent Labs Lab 07/21/15 1811  WBC 13.5*  HGB 9.7*  HCT 31.1*  PLT 245   11/21 @ 1800: PC ratio 270, Uric acid 3.4  Discharge Exam: Patient Vitals for the past 24 hrs:  BP Temp Temp src Pulse Resp Height Weight  07/22/15 0645 (!) 143/76 mmHg 98 F (36.7 C) Oral 78 20 - -  07/22/15 0540 - - - - 18 - -  07/22/15 0446 (!) 149/68 mmHg - - 70 18 - -  07/22/15 0340 - - - - 16 - -  07/22/15 0240 124/63 mmHg 98.7 F (37.1 C) Oral 68 18 - -  07/22/15 0210 - - - - 16 - -  07/22/15 0058 (!) 144/71 mmHg - - 66 20 - -  07/22/15 0007 133/67 mmHg - - 83 (!) 22 - -  07/21/15 2306 139/84 mmHg 98.4 F (36.9 C) Oral 75 18 - -  07/21/15 2204 (!) 143/76 mmHg - - 71 18 - -  07/21/15 2134 135/82 mmHg 98.1 F (36.7 C) Oral 73 20 - -  07/21/15 2104 (!) 147/82 mmHg - - 72 - - -  07/21/15 2034 (!) 143/87 mmHg - - 78 20 - -  07/21/15 2004 (!) 159/97 mmHg - - 81 18 5\' 2"  (1.575 m) 145 lb (65.772 kg)  07/21/15 1949 (!) 144/97 mmHg - - 70 - - -  07/21/15 1936 (!) 154/101 mmHg - - 67 - - -  07/21/15 1927 (!) 167/101 mmHg - - 81 - - -  07/21/15 1912 (!) 174/108 mmHg - - 76 18 - -  07/21/15 1857 (!) 180/118 mmHg - - 81 - - -  07/21/15 1849 (!) 159/107 mmHg - - 76 - - -  07/21/15 1827 (!) 156/110 mmHg - - 71 - - -  07/21/15 1813 (!) 153/91 mmHg - - 69 - - -   07/21/15 1742 (!) 167/93 mmHg 98.3 F (36.8 C) Oral 63 - - -    EFM: 130 accels, no decels, mod variability Toco: negative in the past; not currently on Gen: NAD RRR no MRGs CTAB 1+ brachial b/l Gravid, nttp  Discharge [facility]  Patient Instructions:  None   Results Pending at Discharge:  None  Discharge Medications: Labetalol 200mg  po bid  Future Appointments Date Time Provider Department Center  07/28/2015 12:00 PM [facility] US 1 [facility]  08/04/2015 9:00 AM [facility]  08/11/2015 1:00 PM [facility]    Cornelia Copaharlie Gessica Jawad, Jr. MD [facility] Pager [phone]

## 2015-07-22 NOTE — Plan of Care (Signed)
Problem: Physical Regulation: Goal: Complications related to the disease process, condition or treatment will be avoided or minimized Outcome: Progressing Monitoring pt's BP

## 2015-07-22 NOTE — Progress Notes (Signed)
[facility] called for transport to [facility]. Estimated arrival 45 minutes

## 2015-07-22 NOTE — Final Progress Note (Signed)
Physician Final Progress Note  Patient ID: Charlene RisenBridgit K Ghuman MRN: [id number] DOB/AGE: 1991-04-11 23 y.o.  Admit date: 07/21/2015 Admitting provider: Nadara Mustardobert P Harris, MD Discharge date: 07/22/2015  Admission Diagnoses: Hypertension at 31 weeks pregnancy  Discharge Diagnoses:  Active Problems:   Irregular contractions   Hypertension, pregnancy  Consults: None  Significant Findings/ Diagnostic Studies: labs: No significant proteinuria, Plt and LFT normal.  Mild anemia, c/w prior CBC 3 weeks ago.  Procedures: NST R- A NST procedure was performed with FHR monitoring and a normal baseline established, appropriate time of 20-40 minutes of evaluation, and accels >2 seen w 15x15 characteristics.  Results show a REACTIVE NST.   Serial BPs were obtained over 12+ hour monitoring.  No h/a, blurry vision, CP, SOB, RUQ pain, edema noted.  BPs were 150-180/100 initially.  Three doses of Labetlol IV were givern (10 mg), then a po dose of 200mg  was given once BPs down.  Overnight and after this dose no further elevations this high; BPs ranging from 120-140s/70-80s.  Discharge Condition: good Discharge Exam:  Gen NAD Chest clear Heart reg Abd gravid, NT, no RUQ T FHT 140s Extr no edema or calf T  Disposition: 01-Home or Self Care  Diet: Regular diet  Discharge Activity: Activity as tolerated     Medication List    TAKE these medications        ferrous fumarate 325 (106 FE) MG Tabs tablet  Commonly known as:  HEMOCYTE - 106 mg FE  Take 1 tablet by mouth 2 (two) times daily.     labetalol 200 MG tablet  Commonly known as:  NORMODYNE  Take 1 tablet (200 mg total) by mouth 2 (two) times daily.     oxyCODONE 5 MG immediate release tablet  Commonly known as:  ROXICODONE  Take 1 tablet (5 mg total) by mouth every 6 (six) hours as needed for moderate pain. Do not drive while taking this medication.     prenatal multivitamin Tabs tablet  Take 1 tablet by mouth daily at 12 noon.     vitamin B-6 25 MG tablet  Commonly known as:  pyridOXINE  Take 25 mg by mouth daily.           Follow-up Information    Follow up with Letitia LibraHARRIS,ROBERT PAUL, MD In 1 day.   Specialty:  Obstetrics and Gynecology   Why:  for BP check   Contact information:   [address] [place] [address] [phone]      Multiple interactions and counseling with patient  Signed: Letitia LibraHARRIS,ROBERT PAUL 07/22/2015, 7:16 AM

## 2015-07-22 NOTE — Progress Notes (Addendum)
L&D Note  07/22/2015 - 9:22 AM  24 y.o. G1 473w2d (EDC 1/22, dated by 13wk u/s). Pregnancy complicated by strong FHx of OI, fetus with likely OI, chronic FGR, h/o STI with negative TOC  Ms. Geannie RisenBridgit K Modi is admitted for new onset HTN evaluation  Overnight events:  patient seen by [facility] on 11/21 for 3wk rpt growth and had above u/s with BP 145/94 and patient sent to L&D, with above BPs noted. Patient received labetalol 10mg  IV x 3, with last dose at 1937 on 11/21 and started on labetalol 200mg  PO bid with first dose given on 2120 on 11/21. Patient asymptomatic aside from BPs with below negative labs (borderline PC ratio). She received BMZ #1 on 1915 on 11/21. Patient watched overnight with category I EFM and BPs back to normal to just mild range BPs.    Subjective:  No s/s of pre-eclampsia or decreased FM   Objective:   Patient Vitals for the past 24 hrs:  BP Temp Temp src Pulse Resp Height Weight  07/22/15 0645 (!) 143/76 mmHg 98 F (36.7 C) Oral 78 20 - -  07/22/15 0540 - - - - 18 - -  07/22/15 0446 (!) 149/68 mmHg - - 70 18 - -  07/22/15 0340 - - - - 16 - -  07/22/15 0240 124/63 mmHg 98.7 F (37.1 C) Oral 68 18 - -  07/22/15 0210 - - - - 16 - -  07/22/15 0058 (!) 144/71 mmHg - - 66 20 - -  07/22/15 0007 133/67 mmHg - - 83 (!) 22 - -  07/21/15 2306 139/84 mmHg 98.4 F (36.9 C) Oral 75 18 - -  07/21/15 2204 (!) 143/76 mmHg - - 71 18 - -  07/21/15 2134 135/82 mmHg 98.1 F (36.7 C) Oral 73 20 - -  07/21/15 2104 (!) 147/82 mmHg - - 72 - - -  07/21/15 2034 (!) 143/87 mmHg - - 78 20 - -  07/21/15 2004 (!) 159/97 mmHg - - 81 18 5\' 2"  (1.575 m) 145 lb (65.772 kg)  07/21/15 1949 (!) 144/97 mmHg - - 70 - - -  07/21/15 1936 (!) 154/101 mmHg - - 67 - - -  07/21/15 1927 (!) 167/101 mmHg - - 81 - - -  07/21/15 1912 (!) 174/108 mmHg - - 76 18 - -  07/21/15 1857 (!) 180/118 mmHg - - 81 - - -  07/21/15 1849 (!) 159/107 mmHg - - 76 - - -  07/21/15 1827 (!) 156/110 mmHg - - 71 - - -   07/21/15 1813 (!) 153/91 mmHg - - 69 - - -  07/21/15 1742 (!) 167/93 mmHg 98.3 F (36.8 C) Oral 63 - - -   EFM: 130 accels, no decels, mod variability Toco: negative in the past; not currently on Gen: NAD RRR no MRGs CTAB 1+ brachial b/l Gravid, nttp   Recent Labs Lab 07/21/15 1811  WBC 13.5*  HGB 9.7*  HCT 31.1*  PLT 245    Recent Labs Lab 07/21/15 1811  NA 128*  K 3.5  CL 101  CO2 22  BUN 10  CREATININE 0.48  CALCIUM 8.2*  PROT 6.9  BILITOT 0.2*  ALKPHOS 109  ALT 50  AST 33  GLUCOSE 80  Uric Acid: 3.4 PC ratio: 270  Medications Current Facility-Administered Medications  Medication Dose Route Frequency Provider Last Rate Last Dose  . acetaminophen (TYLENOL) tablet 650 mg  650 mg Oral Q4H PRN Nadara Mustardobert P Harris,  MD      . betamethasone acetate-betamethasone sodium phosphate (CELESTONE) injection 12 mg  12 mg Intramuscular Q24 Hr x 2 Nadara Mustard, MD   12 mg at 07/21/15 2045  . labetalol (NORMODYNE) tablet 200 mg  200 mg Oral BID Nadara Mustard, MD   200 mg at 07/22/15 6578  . labetalol (NORMODYNE,TRANDATE) injection 10 mg  10 mg Intravenous Q10 min PRN Nadara Mustard, MD   10 mg at 07/21/15 1937  . ondansetron (ZOFRAN) injection 4 mg  4 mg Intravenous Q6H PRN Nadara Mustard, MD        Assessment & Plan:  Pt stable *IUP: category I tracing with accels, fetal status reassuring -11/21: EFW 6%, 1397gm, AC <3%, AFI 15.1, S/D normal, no e/o fractures -10/31 @ 28/1: EFW 9%, EFW 103gm, AC 13%, AFI normal, normal dopplers *HTN: Given initial admit BPs and need for IV medications, concern that this meets the new criteria for pre-eclampsia with severe features based on BP criteria alone.  I d/w this with [facility] MFM and they agree that she likely has severe criteria but at the very least needs to be monitored as an inpatient until delivery.  I d/w this with the patient and her partner and how quickly the situation can deteriorate and need for expedited delivery can  occur and I believe that her and fetus (only 31wks and growth restricted) are better in a facility that can take care of both of them. They are amenable to transfer to [facility]. D/w MFM Dr. Idamae Schuller and since hasn't been on Mg, BPs better controlled and labs normal, and would've come off Mg in 12 hours, after 2nd BMZ dose, can hold off on starting it.   -Rpt HELLP labs now -continue EFM until transferred to [facility] -continue with bid labetalol -Can saline lock IV since on regular diet already; pt told to not eat heavily -will start 24hr urine collection now *FGR: see above. Continue with qday NSTs, qwk dopplers and AFI, q3wk growth u/s *OI: fetus with likely OI; has 50% risk and although no e/o fractures, at 28wk u/s there was e/o cranial compression with fundal compression -green top tube at delivery -avoid OVD *Preterm: NICU consult PRN. BMZ #2 due at 1915 on 11/22 *Analgesia: no needs *PPx: SCDs  Cornelia Copa MD [facility] Pager [phone]

## 2015-07-22 NOTE — Discharge Instructions (Signed)
Follow up with Dr. Harris at [facility], Wednesday, November 22nd at 10:30 in the [place] office.   Return to [facility] at 9:00 PM this evening for your second dose of steroids for your baby's lungs.    Take your blood pressure medicine twice daily as prescribed.  Continue to take your iron as prescribed.                                        Hypertension During Pregnancy Hypertension, or high blood pressure, is when there is extra pressure inside your blood vessels that carry blood from the heart to the rest of your body (arteries). It can happen at any time in life, including pregnancy. Hypertension during pregnancy can cause problems for you and your baby. Your baby might not weigh as much as he or she should at birth or might be born early (premature). Very bad cases of hypertension during pregnancy can be life-threatening.  Different types of hypertension can occur during pregnancy. These include:  Chronic hypertension. This happens when a woman has hypertension before pregnancy and it continues during pregnancy.  Gestational hypertension. This is when hypertension develops during pregnancy.  Preeclampsia or toxemia of pregnancy. This is a very serious type of hypertension that develops only during pregnancy. It affects the whole body and can be very dangerous for both mother and baby.  Gestational hypertension and preeclampsia usually go away after your baby is born. Your blood pressure will likely stabilize within 6 weeks. Women who have hypertension during pregnancy have a greater chance of developing hypertension later in life or with future pregnancies. RISK FACTORS There are certain factors that make it more likely for you to develop hypertension during pregnancy. These include:  Having hypertension before pregnancy.  Having hypertension during a previous pregnancy.  Being overweight.  Being older than 40 years.  Being  pregnant with more than one baby.  Having diabetes or kidney problems. SIGNS AND SYMPTOMS Chronic and gestational hypertension rarely cause symptoms. Preeclampsia has symptoms, which may include:  Increased protein in your urine. Your health care provider will check for this at every prenatal visit.  Swelling of your hands and face.  Rapid weight gain.  Headaches.  Visual changes.  Being bothered by light.  Abdominal pain, especially in the upper right area.  Chest pain.  Shortness of breath.  Increased reflexes.  Seizures. These occur with a more severe form of preeclampsia, called eclampsia. DIAGNOSIS  You may be diagnosed with hypertension during a regular prenatal exam. At each prenatal visit, you may have:  Your blood pressure checked.  A urine test to check for protein in your urine. The type of hypertension you are diagnosed with depends on when you developed it. It also depends on your specific blood pressure reading.  Developing hypertension before 20 weeks of pregnancy is consistent with chronic hypertension.  Developing hypertension after 20 weeks of pregnancy is consistent with gestational hypertension.  Hypertension with increased urinary protein is diagnosed as preeclampsia.  Blood pressure measurements that stay above 160 systolic or 110 diastolic are a sign of severe preeclampsia. TREATMENT Treatment for hypertension during pregnancy varies. Treatment depends on the type of hypertension and how serious it is.  If you take medicine for chronic hypertension, you may need to switch medicines.  Medicines called ACE inhibitors should not be taken during pregnancy.  Low-dose aspirin may be suggested for women  who have risk factors for preeclampsia.  If you have gestational hypertension, you may need to take a blood pressure medicine that is safe during pregnancy. Your health care provider will recommend the correct medicine.  If you have severe  preeclampsia, you may need to be in the hospital. Health care providers will watch you and your baby very closely. You also may need to take medicine called magnesium sulfate to prevent seizures and lower blood pressure.  Sometimes, an early delivery is needed. This may be the case if the condition worsens. It would be done to protect you and your baby. The only cure for preeclampsia is delivery.  Your health care provider may recommend that you take one low-dose aspirin (81 mg) each day to help prevent high blood pressure during your pregnancy if you are at risk for preeclampsia. You may be at risk for preeclampsia if:  You had preeclampsia or eclampsia during a previous pregnancy.  Your baby did not grow as expected during a previous pregnancy.  You experienced preterm birth with a previous pregnancy.  You experienced a separation of the placenta from the uterus (placental abruption) during a previous pregnancy.  You experienced the loss of your baby during a previous pregnancy.  You are pregnant with more than one baby.  You have other medical conditions, such as diabetes or an autoimmune disease. HOME CARE INSTRUCTIONS  Schedule and keep all of your regular prenatal care appointments. This is important.  Take medicines only as directed by your health care provider. Tell your health care provider about all medicines you take.  Eat as little salt as possible.  Get regular exercise.  Do not drink alcohol.  Do not use tobacco products.  Do not drink products with caffeine.  Lie on your left side when resting. SEEK IMMEDIATE MEDICAL CARE IF:  You have severe abdominal pain.  You have sudden swelling in your hands, ankles, or face.  You gain 4 pounds (1.8 kg) or more in 1 week.  You vomit repeatedly.  You have vaginal bleeding.  You do not feel your baby moving as much.  You have a headache.  You have blurred or double vision.  You have muscle twitching or  spasms.  You have shortness of breath.  You have blue fingernails or lips.  You have blood in your urine. MAKE SURE YOU:  Understand these instructions.  Will watch your condition.  Will get help right away if you are not doing well or get worse.   This information is not intended to replace advice given to you by your health care provider. Make sure you discuss any questions you have with your health care provider.   Document Released: 05/04/2011 Document Revised: 09/06/2014 Document Reviewed: 03/15/2013 Elsevier Interactive Patient Education [employer]2016 [employer]

## 2015-07-28 ENCOUNTER — Ambulatory Visit: Payer: Medicaid Other

## 2015-08-04 ENCOUNTER — Inpatient Hospital Stay: Admit: 2015-08-04 | Payer: Medicaid Other

## 2015-08-11 ENCOUNTER — Ambulatory Visit: Payer: Medicaid Other

## 2016-02-10 ENCOUNTER — Encounter (HOSPITAL_COMMUNITY): Payer: Self-pay | Admitting: *Deleted

## 2016-04-25 IMAGING — US US OB TRANSVAGINAL
1 series · 13 of 28 positions shown · non-contrast
Comparison: None.

CLINICAL DATA: Acute onset of right-sided pelvic pain and lower
back pain. Initial encounter.

EXAM:
OBSTETRIC <14 WK US AND TRANSVAGINAL OB US
TECHNIQUE: Both transabdominal and transvaginal ultrasound examinations were
performed for complete evaluation of the gestation as well as the
maternal uterus, adnexal regions, and pelvic cul-de-sac.
Transvaginal technique was performed to assess early pregnancy.

[Series 1: us ob transvaginal · 0.20mm/px · 13 of 107 slices shown]
[im 4/107]
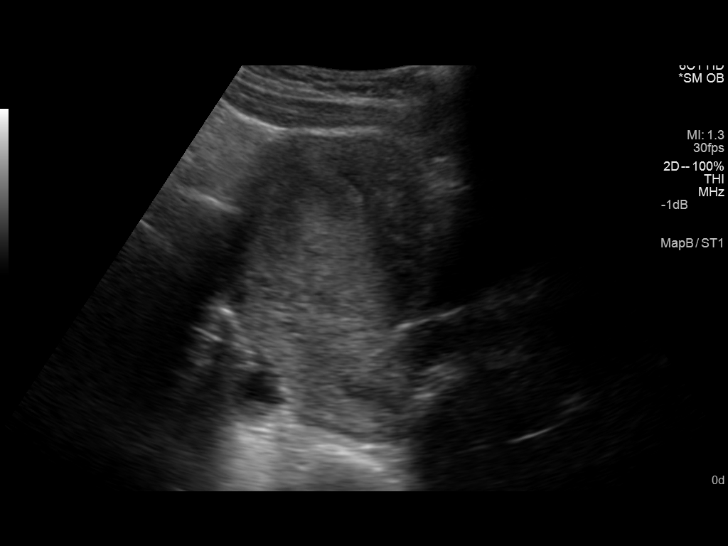
[im 12/107]
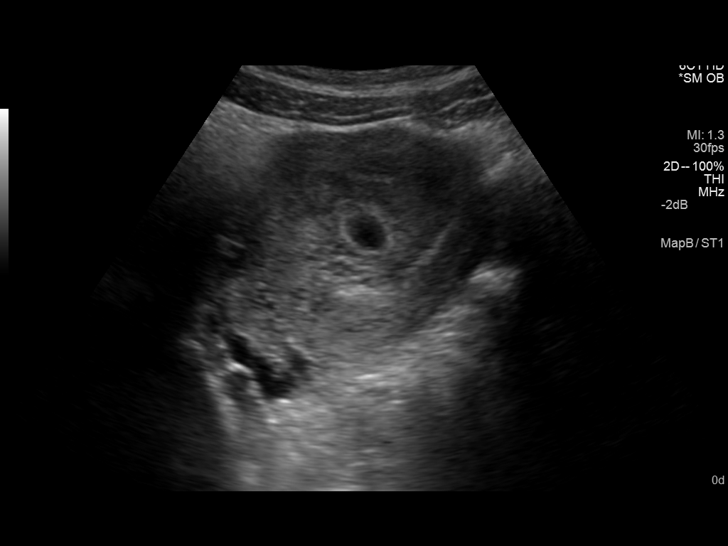
[im 20/107]
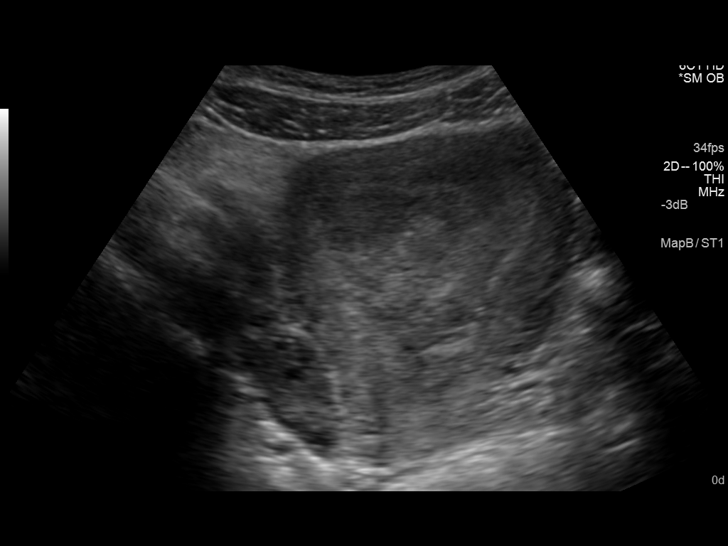
[im 28/107]
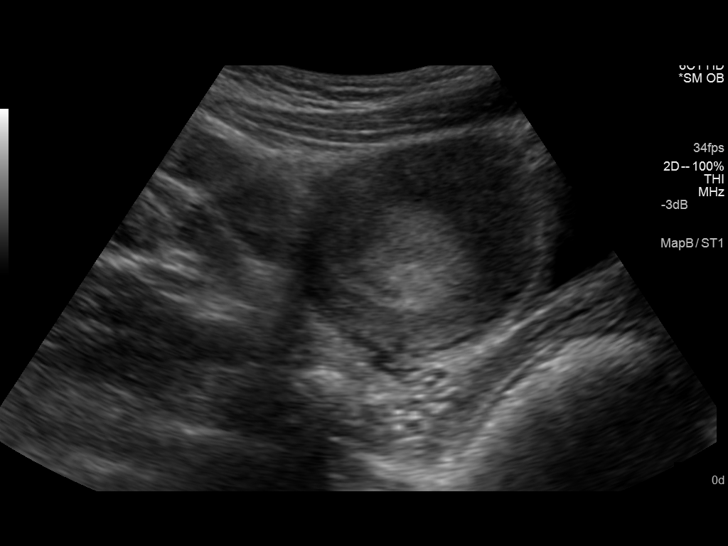
[im 36/107]
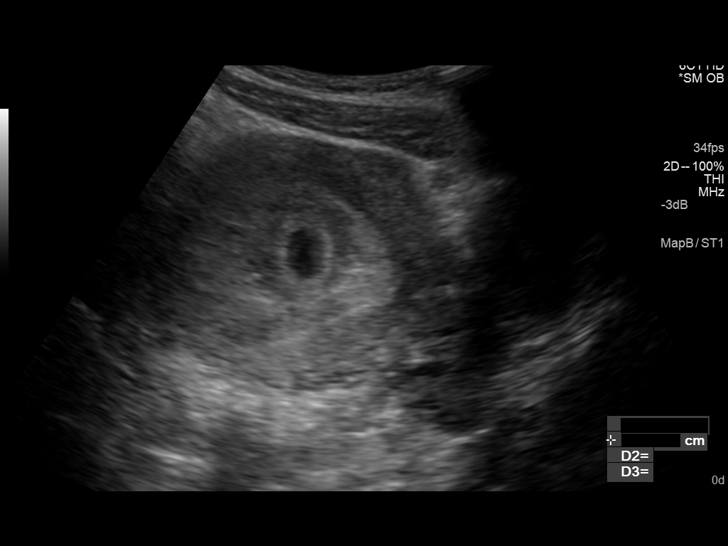
[im 44/107]
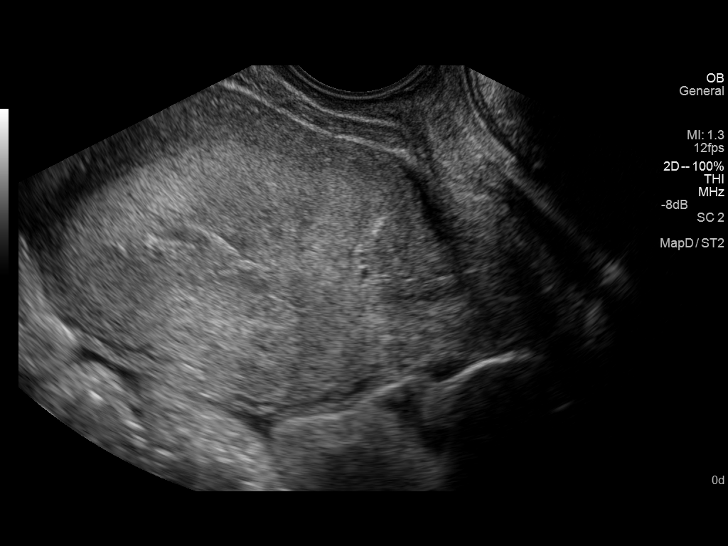
[im 55/107]
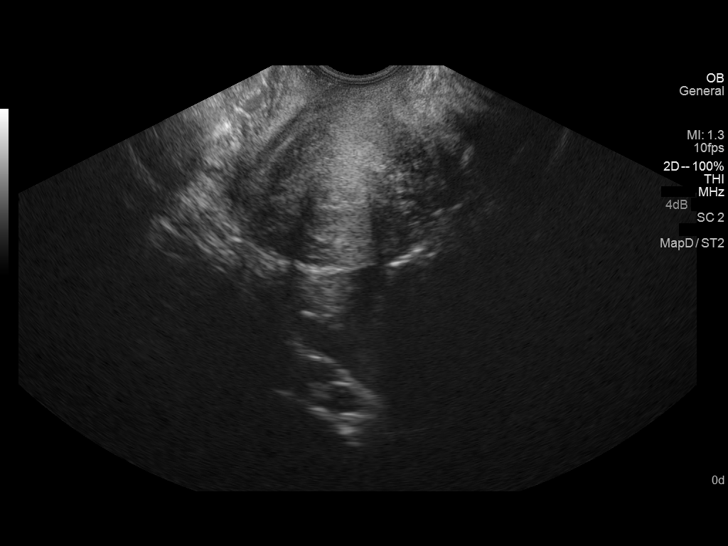
[im 63/107]
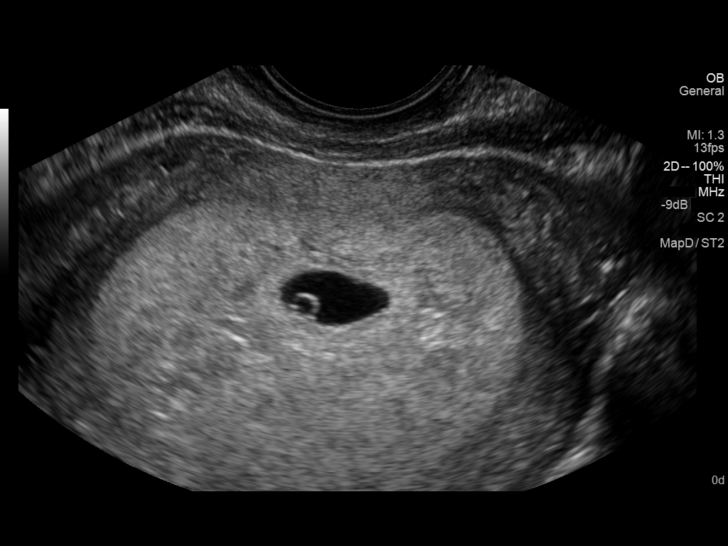
[im 71/107]
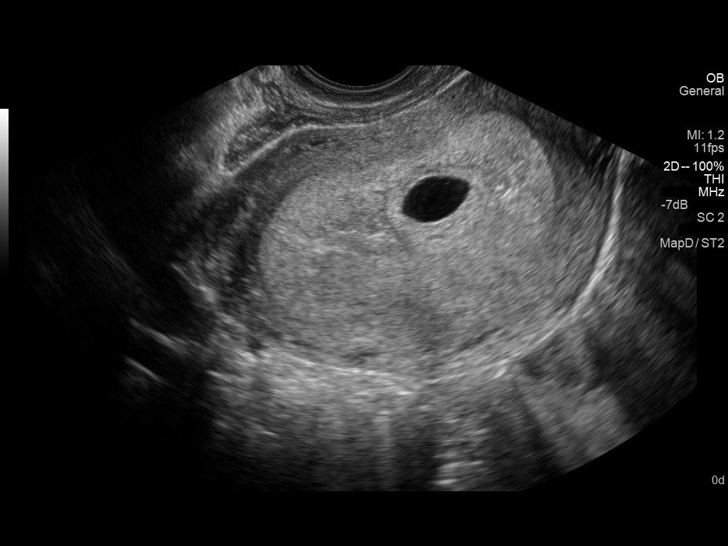
[im 79/107]
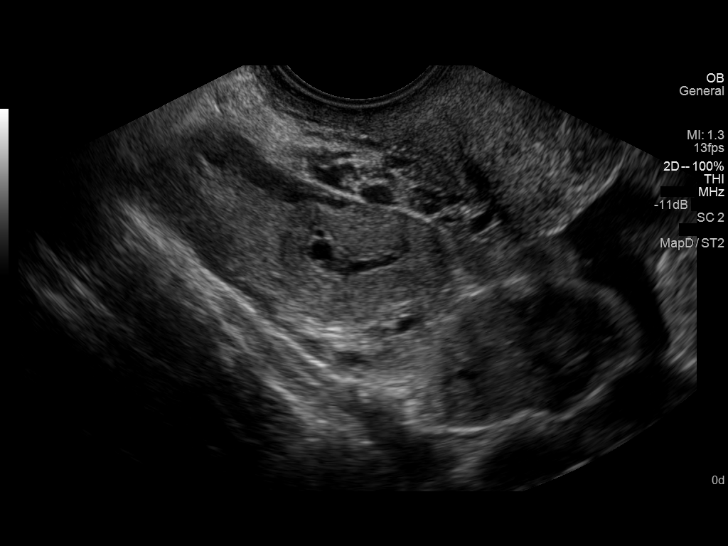
[im 87/107]
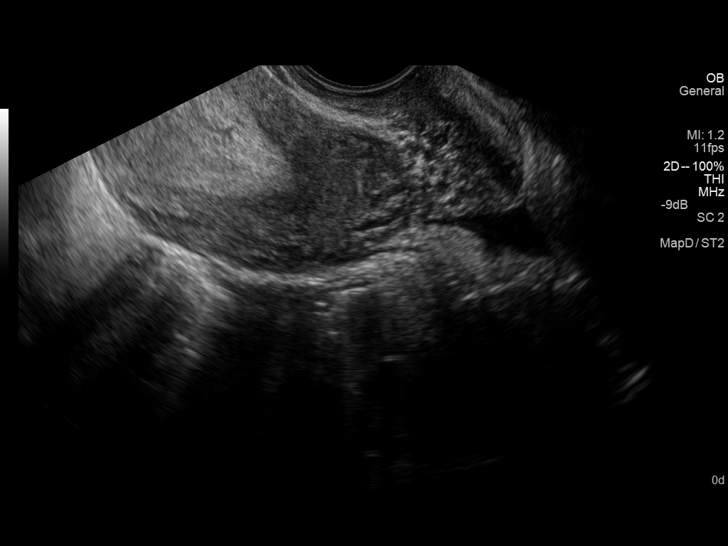
[im 95/107]
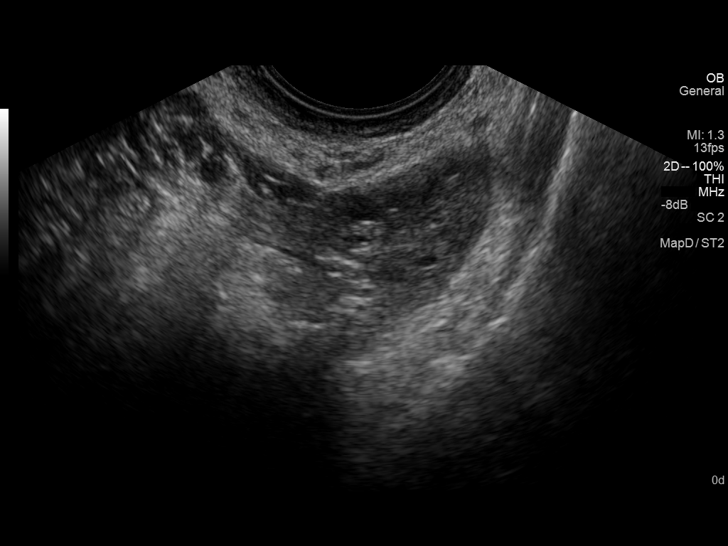
[im 103/107]
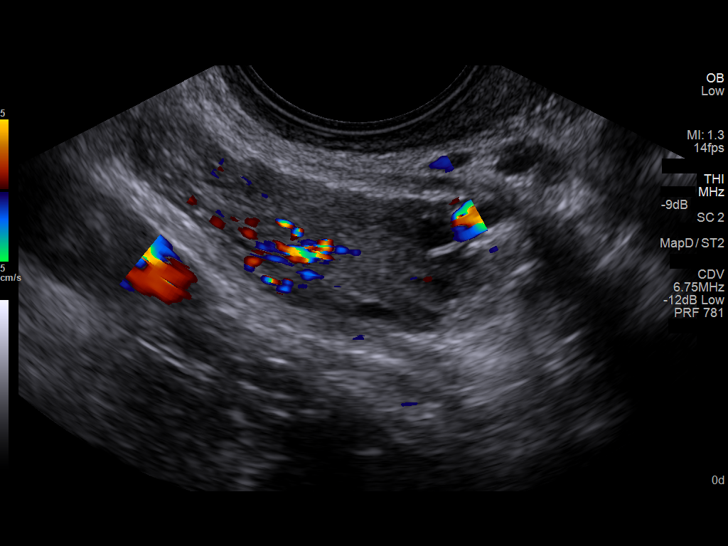

[13 of 28 positions shown; findings below may reference images not displayed]

FINDINGS: Intrauterine gestational sac: Visualized/normal in shape.

Yolk sac:  Yes

Embryo:  No

Cardiac Activity: N/A

MSD:  1.2 cm   6 w   0  d

Maternal uterus/adnexae: No subchorionic hemorrhage is noted. The
uterus is otherwise unremarkable.

The ovaries are within normal limits. The right ovary measures 4.7 x
2.4 x 2.4 cm, while the left ovary measures 3.8 x 1.5 x 3.0 cm. No
suspicious adnexal masses are seen; there is no evidence for ovarian
torsion.

Trace free fluid is seen within the pelvic cul-de-sac.
IMPRESSION: Single intrauterine gestational sac noted, with a mean sac diameter
of 1.2 cm, corresponding to a gestational age of 6 weeks 0 days.
This matches the gestational age of 5 weeks 0 days by LMP,
reflecting an estimated date of delivery September 26, 2015. A yolk
sac is visualized. The embryo is not yet seen, within normal limits
given the size of the gestational sac.

## 2016-07-12 ENCOUNTER — Telehealth: Payer: Self-pay

## 2016-07-12 NOTE — Telephone Encounter (Signed)
[facility]  Lm with grandmother for patient to call back and schedule an appointment Notes are in pink files

## 2016-07-13 IMAGING — CR DG FOREARM 2V*L*
1 series · 2 of 2 positions shown · non-contrast
Comparison: None.

CLINICAL DATA: Initial evaluation for acute trauma, fall. Pain in
distal forearm.

EXAM:
LEFT FOREARM - 2 VIEW

[Series 1: ap · 0.17mm/px · 2 of 2 slices shown]
[im 1/2]
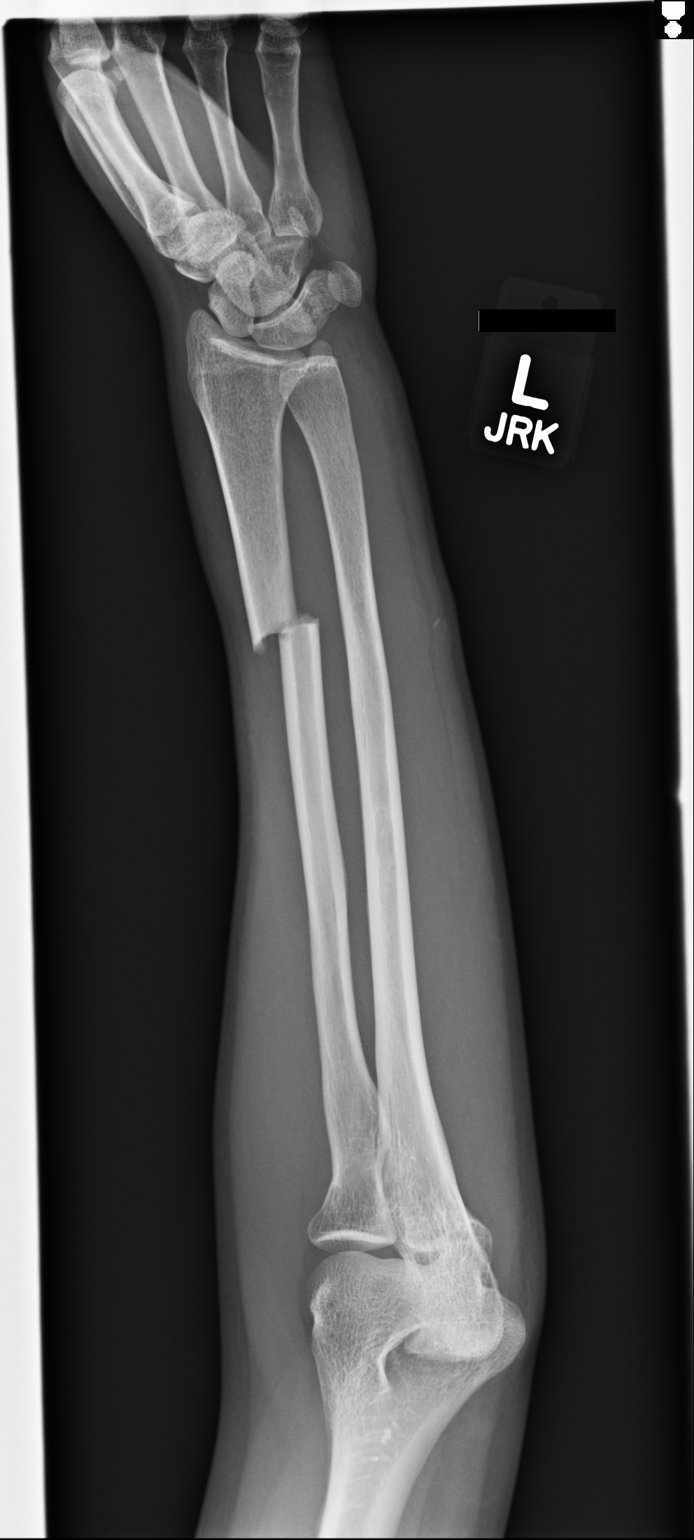
[im 2/2]
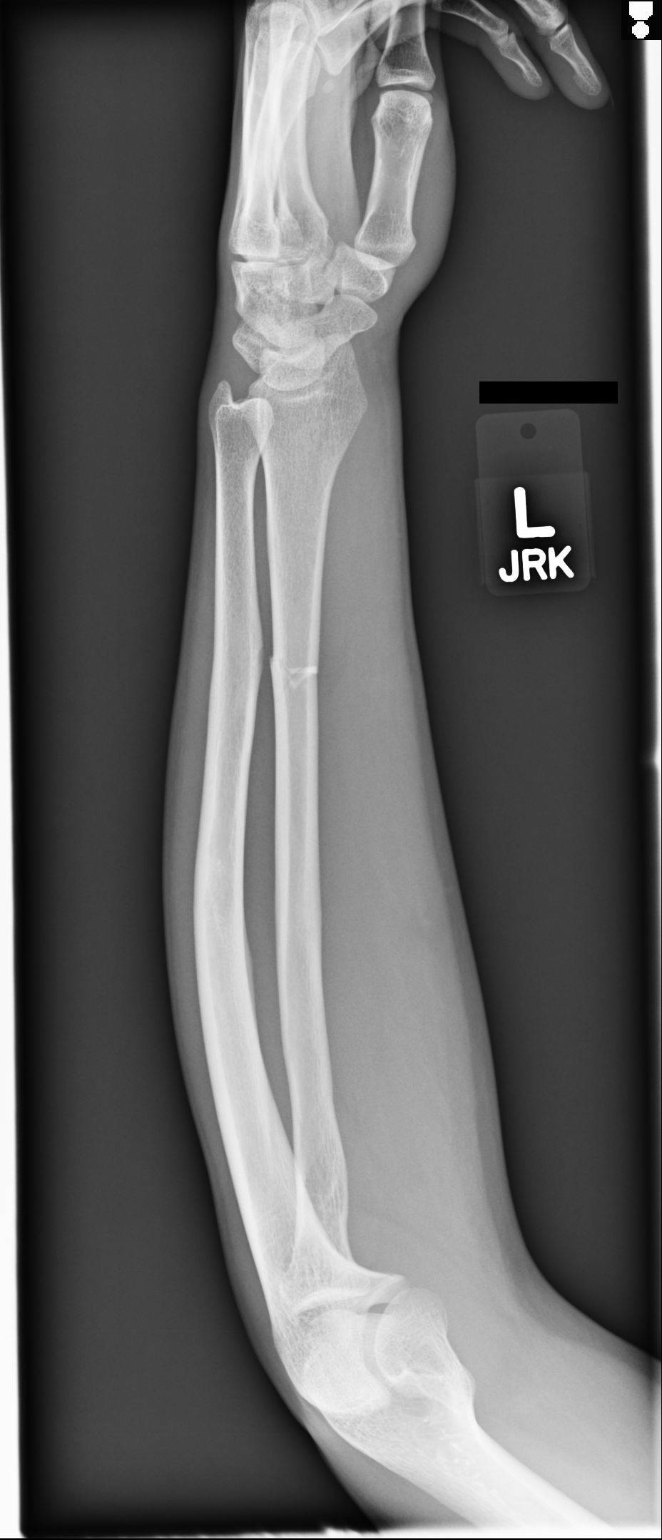

[2 of 2 positions shown; findings below may reference images not displayed]

FINDINGS: There is an acute transverse fracture of the mid-distal shaft of the
radius with slight 6 mm of radial displacement. Ulna intact. Wrist
and elbow all remain in normal alignment. Tiny 4 mm density noted
with a ulnar soft tissues of the mid forearm. Indeterminate. No
other soft tissue abnormality.
IMPRESSION: 1. Acute transverse fracture of the mid- distal shaft of the radius
with 6 mm of radial displacement.
2. 4 mm radiopaque density within the superficial soft tissues of
the mid forearm, indeterminate. Correlation with physical exam
recommended.

## 2016-07-28 NOTE — Telephone Encounter (Signed)
Pt is coming 07/29/16 to see Dr Alvino ChapelIngal

## 2016-07-29 ENCOUNTER — Encounter: Payer: Self-pay | Admitting: Cardiology

## 2016-07-29 ENCOUNTER — Ambulatory Visit (INDEPENDENT_AMBULATORY_CARE_PROVIDER_SITE_OTHER): Payer: Medicaid Other | Admitting: Cardiology

## 2016-07-29 VITALS — BP 132/80 | HR 66 | Ht 62.0 in | Wt 160.2 lb

## 2016-07-29 DIAGNOSIS — R030 Elevated blood-pressure reading, without diagnosis of hypertension: Secondary | ICD-10-CM

## 2016-07-29 NOTE — Patient Instructions (Addendum)
Instructions:  Your physician has requested that you regularly monitor and record your blood pressure readings at home. Please use the same machine at the same time of day to check your readings and record them to bring to your follow-up visit.  Please call if blood pressure remains higher than 140/80  Follow-Up: Your physician recommends that you schedule a follow-up appointment in: January 2018 with Dr. Alvino ChapelIngal.  It was a pleasure seeing you today here in the office. Please do not hesitate to give us a call back if you have any further questions. [phone]  Medora CellarPamela A. RN, BSN     How to Take Your Blood Pressure HOW DO I GET A BLOOD PRESSURE MACHINE?  You can buy an electronic home blood pressure machine at your local pharmacy. Insurance will sometimes cover the cost if you have a prescription.  Ask your doctor what type of machine is best for you. There are different machines for your arm and your wrist.  If you decide to buy a machine to check your blood pressure on your arm, first check the size of your arm so you can buy the right size cuff. To check the size of your arm:   Use a measuring tape that shows both inches and centimeters.   Wrap the measuring tape around the upper-middle part of your arm. You may need someone to help you measure.   Write down your arm measurement in both inches and centimeters.   To measure your blood pressure correctly, it is important to have the right size cuff.   If your arm is up to 13 inches (up to 34 centimeters), get an adult cuff size.  If your arm is 13 to 17 inches (35 to 44 centimeters), get a large adult cuff size.    If your arm is 17 to 20 inches (45 to 52 centimeters), get an adult thigh cuff.  WHAT DO THE NUMBERS MEAN?   There are two numbers that make up your blood pressure. For example: 120/80.  The first number (120 in our example) is called the "systolic pressure." It is a measure of the pressure in your blood  vessels when your heart is pumping blood.  The second number (80 in our example) is called the "diastolic pressure." It is a measure of the pressure in your blood vessels when your heart is resting between beats.  Your doctor will tell you what your blood pressure should be. WHAT SHOULD I DO BEFORE I CHECK MY BLOOD PRESSURE?   Try to rest or relax for at least 30 minutes before you check your blood pressure.  Do not smoke.  Do not have any drinks with caffeine, such as:  Soda.  Coffee.  Tea.  Check your blood pressure in a quiet room.  Sit down and stretch out your arm on a table. Keep your arm at about the level of your heart. Let your arm relax.  Make sure that your legs are not crossed. HOW DO I CHECK MY BLOOD PRESSURE?  Follow the directions that came with your machine.  Make sure you remove any tight-fitting clothing from your arm or wrist. Wrap the cuff around your upper arm or wrist. You should be able to fit a finger between the cuff and your arm. If you cannot fit a finger between the cuff and your arm, it is too tight and should be removed and rewrapped.  Some units require you to manually pump up the arm cuff.  Automatic  units inflate the cuff when you press a button.  Cuff deflation is automatic in both models.  After the cuff is inflated, the unit measures your blood pressure and pulse. The readings are shown on a monitor. Hold still and breathe normally while the cuff is inflated.  Getting a reading takes less than a minute.  Some models store readings in a memory. Some provide a printout of readings. If your machine does not store your readings, keep a written record.  Take readings with you to your next visit with your doctor. This information is not intended to replace advice given to you by your health care provider. Make sure you discuss any questions you have with your health care provider. Document Released: 07/29/2008 Document Revised: 09/06/2014  Document Reviewed: 10/11/2013 Elsevier Interactive Patient Education  2017 Else[employer] Inc[employer] Introduction Your blood pressure on this visit to the emergency department or clinic is elevated. This does not necessarily mean you have high blood pressure (hypertension), but it does mean that your blood pressure needs to be rechecked. Many times your blood pressure can increase due to illness, pain, anxiety, or other factors. We recommend that you get a series of blood pressure readings done over a period of 5 days. It is best to get a reading in the morning and one in the evening. You should make sure to sit and relax for 1-5 minutes before the reading is taken. Write the readings down and make a follow-up appointment with your health care provider to discuss the results. If there is not a free clinic or a drug store with a blood-pressure-taking machine near you, you can purchase blood-pressure-taking equipment from a drug store. Having one in the home allows you the convenience of taking your blood pressure while you are home and relaxed. BLOOD PRESSURE LOG  Date: _______________________  a.m. _____________________  p.m. _____________________ Date: _______________________  a.m. _____________________  p.m. _____________________ Date: _______________________  a.m. _____________________  p.m. _____________________ Date: _______________________  a.m. _____________________  p.m. _____________________ Date: _______________________  a.m. _____________________  p.m. _____________________ This information is not intended to replace advice given to you by your health care provider. Make sure you discuss any questions you have with your health care provider. Document Released: 05/15/2003 Document Revised: 03/05/2016 Document Reviewed: 10/09/2013  2017 Elsevier  Heart-Healthy Eating Plan Introduction Heart-healthy meal planning includes:  Limiting unhealthy fats.  Increasing healthy  fats.  Making other small dietary changes. You may need to talk with your doctor or a diet specialist (dietitian) to create an eating plan that is right for you. What types of fat should I choose?  Choose healthy fats. These include olive oil and canola oil, flaxseeds, walnuts, almonds, and seeds.  Eat more omega-3 fats. These include salmon, mackerel, sardines, tuna, flaxseed oil, and ground flaxseeds. Try to eat fish at least twice each week.  Limit saturated fats.  Saturated fats are often found in animal products, such as meats, butter, and cream.  Plant sources of saturated fats include palm oil, palm kernel oil, and coconut oil.  Avoid foods with partially hydrogenated oils in them. These include stick margarine, some tub margarines, cookies, crackers, and other baked goods. These contain trans fats. What general guidelines do I need to follow?  Check food labels carefully. Identify foods with trans fats or high amounts of saturated fat.  Fill one half of your plate with vegetables and green salads. Eat 4-5 servings of vegetables per day. A serving of vegetables is:  1 cup of  raw leafy vegetables.   cup of raw or cooked cut-up vegetables.   cup of vegetable juice.  Fill one fourth of your plate with whole grains. Look for the word "whole" as the first word in the ingredient list.  Fill one fourth of your plate with lean protein foods.  Eat 4-5 servings of fruit per day. A serving of fruit is:  One medium whole fruit.   cup of dried fruit.   cup of fresh, frozen, or canned fruit.   cup of 100% fruit juice.  Eat more foods that contain soluble fiber. These include apples, broccoli, carrots, beans, peas, and barley. Try to get 20-30 g of fiber per day.  Eat more home-cooked food. Eat less restaurant, buffet, and fast food.  Limit or avoid alcohol.  Limit foods high in starch and sugar.  Avoid fried foods.  Avoid frying your food. Try baking, boiling,  grilling, or broiling it instead. You can also reduce fat by:  Removing the skin from poultry.  Removing all visible fats from meats.  Skimming the fat off of stews, soups, and gravies before serving them.  Steaming vegetables in water or broth.  Lose weight if you are overweight.  Eat 4-5 servings of nuts, legumes, and seeds per week:  One serving of dried beans or legumes equals  cup after being cooked.  One serving of nuts equals 1 ounces.  One serving of seeds equals  ounce or one tablespoon.  You may need to keep track of how much salt or sodium you eat. This is especially true if you have high blood pressure. Talk with your doctor or dietitian to get more information. What foods can I eat? Grains  Breads, including [place], white, pita, wheat, raisin, rye, oatmeal, and [place]. Tortillas that are neither fried nor made with lard or trans fat. Low-fat rolls, including hotdog and hamburger buns and English muffins. Biscuits. Muffins. Waffles. Pancakes. Light popcorn. Whole-grain cereals. Flatbread. Melba toast. Pretzels. Breadsticks. Rusks. Low-fat snacks. Low-fat crackers, including oyster, saltine, matzo, graham, animal, and rye. Rice and pasta, including brown rice and pastas that are made with whole wheat. Vegetables  All vegetables. Fruits  All fruits, but limit coconut. Meats and Other Protein Sources  Lean, well-trimmed beef, veal, pork, and lamb. Chicken and [place] without skin. All fish and shellfish. Wild duck, rabbit, pheasant, and venison. Egg whites or low-cholesterol egg substitutes. Dried beans, peas, lentils, and tofu. Seeds and most nuts. Dairy  Low-fat or nonfat cheeses, including ricotta, string, and mozzarella. Skim or 1% milk that is liquid, powdered, or evaporated. Buttermilk that is made with low-fat milk. Nonfat or low-fat yogurt. Beverages  Mineral water. Diet carbonated beverages. Sweets and Desserts  Sherbets and fruit ices. Honey, jam, marmalade,  jelly, and syrups. Meringues and gelatins. Pure sugar candy, such as hard candy, jelly beans, gumdrops, mints, marshmallows, and small amounts of dark chocolate. [employer]. Eat all sweets and desserts in moderation. Fats and Oils  Nonhydrogenated (trans-free) margarines. Vegetable oils, including soybean, sesame, sunflower, olive, peanut, safflower, corn, canola, and cottonseed. Salad dressings or mayonnaise made with a vegetable oil. Limit added fats and oils that you use for cooking, baking, salads, and as spreads. Other  Cocoa powder. Coffee and tea. All seasonings and condiments. The items listed above may not be a complete list of recommended foods or beverages. Contact your dietitian for more options.  What foods are not recommended? Grains  Breads that are made with saturated or trans fats, oils,  or whole milk. Croissants. Butter rolls. Cheese breads. Sweet rolls. Donuts. Buttered popcorn. Chow mein noodles. High-fat crackers, such as cheese or butter crackers. Meats and Other Protein Sources  Fatty meats, such as hotdogs, short ribs, sausage, spareribs, bacon, rib eye roast or steak, and mutton. High-fat deli meats, such as salami and bologna. Caviar. Domestic duck and goose. Organ meats, such as kidney, liver, sweetbreads, and heart. Dairy  Cream, sour cream, cream cheese, and creamed cottage cheese. Whole-milk cheeses, including blue (bleu), [address], [place], [place], [address], [place], [address], cheddar, [place], and [place]. Whole or 2% milk that is liquid, evaporated, or condensed. Whole buttermilk. Cream sauce or high-fat cheese sauce. Yogurt that is made from whole milk. Beverages  Regular sodas and juice drinks with added sugar. Sweets and Desserts  Frosting. Pudding. Cookies. Cakes other than angel food cake. Candy that has milk chocolate or white chocolate, hydrogenated fat, butter, coconut, or unknown ingredients. Buttered syrups. Full-fat ice cream or ice cream  drinks. Fats and Oils  Gravy that has suet, meat fat, or shortening. Cocoa butter, hydrogenated oils, palm oil, coconut oil, palm kernel oil. These can often be found in baked products, candy, fried foods, nondairy creamers, and whipped toppings. Solid fats and shortenings, including bacon fat, salt pork, lard, and butter. Nondairy cream substitutes, such as coffee creamers and sour cream substitutes. Salad dressings that are made of unknown oils, cheese, or sour cream. The items listed above may not be a complete list of foods and beverages to avoid. Contact your dietitian for more information.  This information is not intended to replace advice given to you by your health care provider. Make sure you discuss any questions you have with your health care provider. Document Released: 02/15/2012 Document Revised: 01/22/2016 Document Reviewed: 02/07/2014  2017 Elsevier  Low-Sodium Eating Plan Sodium raises blood pressure and causes water to be held in the body. Getting less sodium from food will help lower your blood pressure, reduce any swelling, and protect your heart, liver, and kidneys. We get sodium by adding salt (sodium chloride) to food. Most of our sodium comes from canned, boxed, and frozen foods. Restaurant foods, fast foods, and pizza are also very high in sodium. Even if you take medicine to lower your blood pressure or to reduce fluid in your body, getting less sodium from your food is important. What is my plan? Most people should limit their sodium intake to 2,300 mg a day. Your health care provider recommends that you limit your sodium intake to __________ a day. What do I need to know about this eating plan? For the low-sodium eating plan, you will follow these general guidelines:  Choose foods with a % Daily Value for sodium of less than 5% (as listed on the food label).  Use salt-free seasonings or herbs instead of table salt or sea salt.  Check with your health care provider or  pharmacist before using salt substitutes.  Eat fresh foods.  Eat more vegetables and fruits.  Limit canned vegetables. If you do use them, rinse them well to decrease the sodium.  Limit cheese to 1 oz (28 g) per day.  Eat lower-sodium products, often labeled as "lower sodium" or "no salt added."  Avoid foods that contain monosodium glutamate (MSG). MSG is sometimes added to [place] food and some canned foods.  Check food labels (Nutrition Facts labels) on foods to learn how much sodium is in one serving.  Eat more home-cooked food and less restaurant, buffet, and fast food.  When eating at a restaurant, ask that your food be prepared with less salt, or no salt if possible. How do I read food labels for sodium information? The Nutrition Facts label lists the amount of sodium in one serving of the food. If you eat more than one serving, you must multiply the listed amount of sodium by the number of servings. Food labels may also identify foods as:  Sodium free-Less than 5 mg in a serving.  Very low sodium-35 mg or less in a serving.  Low sodium-140 mg or less in a serving.  Light in sodium-50% less sodium in a serving. For example, if a food that usually has 300 mg of sodium is changed to become light in sodium, it will have 150 mg of sodium.  Reduced sodium-25% less sodium in a serving. For example, if a food that usually has 400 mg of sodium is changed to reduced sodium, it will have 300 mg of sodium. What foods can I eat? Grains  Low-sodium cereals, including oats, puffed wheat and rice, and shredded wheat cereals. Low-sodium crackers. Unsalted rice and pasta. Lower-sodium bread. Vegetables  Frozen or fresh vegetables. Low-sodium or reduced-sodium canned vegetables. Low-sodium or reduced-sodium tomato sauce and paste. Low-sodium or reduced-sodium tomato and vegetable juices. Fruits  Fresh, frozen, and canned fruit. Fruit juice. Meat and Other Protein Products  Low-sodium  canned tuna and salmon. Fresh or frozen meat, poultry, seafood, and fish. Lamb. Unsalted nuts. Dried beans, peas, and lentils without added salt. Unsalted canned beans. Homemade soups without salt. Eggs. Dairy  Milk. Soy milk. Ricotta cheese. Low-sodium or reduced-sodium cheeses. Yogurt. Condiments  Fresh and dried herbs and spices. Salt-free seasonings. Onion and garlic powders. Low-sodium varieties of mustard and ketchup. Fresh or refrigerated horseradish. Lemon juice. Fats and Oils  Reduced-sodium salad dressings. Unsalted butter. Other  Unsalted popcorn and pretzels. The items listed above may not be a complete list of recommended foods or beverages. Contact your dietitian for more options.  What foods are not recommended? Grains  Instant hot cereals. Bread stuffing, pancake, and biscuit mixes. Croutons. Seasoned rice or pasta mixes. Noodle soup cups. Boxed or frozen macaroni and cheese. Self-rising flour. Regular salted crackers. Vegetables  Regular canned vegetables. Regular canned tomato sauce and paste. Regular tomato and vegetable juices. Frozen vegetables in sauces. Salted [place] fries. Olives. Rosita FirePickles. Relishes. Sauerkraut. Salsa. Meat and Other Protein Products  Salted, canned, smoked, spiced, or pickled meats, seafood, or fish. Bacon, ham, sausage, hot dogs, corned beef, chipped beef, and packaged luncheon meats. Salt pork. Jerky. Pickled herring. Anchovies, regular canned tuna, and sardines. Salted nuts. Dairy  Processed cheese and cheese spreads. Cheese curds. Blue cheese and cottage cheese. Buttermilk. Condiments  Onion and garlic salt, seasoned salt, table salt, and sea salt. Canned and packaged gravies. Worcestershire sauce. Tartar sauce. Barbecue sauce. Teriyaki sauce. Soy sauce, including reduced sodium. Steak sauce. Fish sauce. Oyster sauce. Cocktail sauce. Horseradish that you find on the shelf. Regular ketchup and mustard. Meat flavorings and tenderizers. Bouillon cubes.  Hot sauce. Tabasco sauce. Marinades. Taco seasonings. Relishes. Fats and Oils  Regular salad dressings. Salted butter. Margarine. Ghee. Bacon fat. Other  Potato and tortilla chips. Corn chips and puffs. Salted popcorn and pretzels. Canned or dried soups. Pizza. Frozen entrees and pot pies. The items listed above may not be a complete list of foods and beverages to avoid. Contact your dietitian for more information.  This information is not intended to replace advice given to you by your health care  provider. Make sure you discuss any questions you have with your health care provider. Document Released: 02/05/2002 Document Revised: 01/22/2016 Document Reviewed: 06/20/2013 Elsevier Interactive Patient Education  2017 [employer].

## 2016-07-29 NOTE — Progress Notes (Signed)
Cardiology Office Note   Date:  07/29/2016   ID:  Charlene Daniel, DOB 1990-10-03, MRN [id number]  Referring Doctor:  No PCP Per Patient PCP (Dr) Alinda DoomsEmily Hedrick  Cardiologist:   Almond LintAileen Soo Steelman, MD   Reason for consultation:  Chief Complaint  Patient presents with  . other    [facility] and the patient had Gestastional HTN during the 3rd triamester of pregnancy. Pt. is not currently pregnant. Meds reviewed by the pt. verbally.       History of Present Illness: Charlene Daniel is a 25 y.o. female who presents for Evaluation of high blood pressure.  Patient reports that during first pregnancy, she developed elevated blood pressure in the third trimester. She was prescribed labetalol and was supposed to be taking it post delivery. She has not been taking it for a while now. She does not routinely check her blood pressure. When she does measure her blood pressure, maybe 2 times a week, occasionally it would be elevated. Maximum would be in the 170s systolic. Sometimes, she does have some headaches with high blood pressure but not all the time.  Patient denies shortness of breath, chest pain, palpitations, nausea, diaphoresis, flushing, edema. No loss of consciousness.   ROS:  Please see the history of present illness. Aside from mentioned under HPI, all other systems are reviewed and negative.     Past Medical History:  Diagnosis Date  . Anemia   . Arm fracture, left 2016  . Osteogenesis imperfecta    Type I    Past Surgical History:  Procedure Laterality Date  . arm surger       reports that she has never smoked. She has never used smokeless tobacco. She reports that she does not drink alcohol or use drugs.   family history is not on file.   Outpatient Medications Prior to Visit  Medication Sig Dispense Refill  . labetalol (NORMODYNE) 200 MG tablet Take 1 tablet (200 mg total) by mouth 2 (two) times daily. (Patient not taking:  Reported on 07/29/2016) 60 tablet 5  . ferrous fumarate (HEMOCYTE - 106 MG FE) 325 (106 FE) MG TABS tablet Take 1 tablet by mouth 2 (two) times daily.    Marland Kitchen. oxyCODONE (ROXICODONE) 5 MG immediate release tablet Take 1 tablet (5 mg total) by mouth every 6 (six) hours as needed for moderate pain. Do not drive while taking this medication. (Patient not taking: Reported on 07/29/2016) 12 tablet 0  . Prenatal Vit-Fe Fumarate-FA (PRENATAL MULTIVITAMIN) TABS tablet Take 1 tablet by mouth daily at 12 noon.    . vitamin B-6 (PYRIDOXINE) 25 MG tablet Take 25 mg by mouth daily.     No facility-administered medications prior to visit.      Allergies: Patient has no known allergies.    PHYSICAL EXAM: VS:  BP 132/80 (BP Location: Right Arm, Patient Position: Sitting, Cuff Size: Normal)   Pulse 66   Ht 5\' 2"  (1.575 m)   Wt 160 lb 4 oz (72.7 kg)   BMI 29.31 kg/m  , Body mass index is 29.31 kg/m. Wt Readings from Last 3 Encounters:  07/29/16 160 lb 4 oz (72.7 kg)  07/21/15 145 lb (65.8 kg)  07/21/15 145 lb (65.8 kg)    GENERAL:  well developed, well nourished, not in acute distress HEENT: normocephalic, pink conjunctivae, anicteric sclerae, no xanthelasma, normal dentition, oropharynx clear NECK:  no neck vein engorgement, JVP normal, no hepatojugular reflux, carotid  upstroke brisk and symmetric, no bruit, no thyromegaly, no lymphadenopathy LUNGS:  good respiratory effort, clear to auscultation bilaterally CV:  PMI not displaced, no thrills, no lifts, S1 and S2 within normal limits, no palpable S3 or S4, no murmurs, no rubs, no gallops ABD:  Soft, nontender, nondistended, normoactive bowel sounds, no abdominal aortic bruit, no hepatomegaly, no splenomegaly MS: nontender back, no kyphosis, no scoliosis, no joint deformities EXT:  2+ DP/PT pulses, no edema, no varicosities, no cyanosis, no clubbing SKIN: warm, nondiaphoretic, normal turgor, no ulcers NEUROPSYCH: alert, oriented to person, place, and  time, sensory/motor grossly intact, normal mood, appropriate affect  Recent Labs: No results found for requested labs within last 8760 hours.   Lipid Panel No results found for: CHOL, TRIG, HDL, CHOLHDL, VLDL, LDLCALC, LDLDIRECT   Other studies Reviewed:  EKG:  The ekg from 07/29/2016 was personally reviewed by me and it revealed sinus rhythm, sinus arrhythmia. Ventricular rate 64 BPM. Normal EKG.  Additional studies/ records that were reviewed personally reviewed by me today include: None available   ASSESSMENT AND PLAN:  Elevated blood pressure measurements Recommend monitoring, BP log. Recommend to start modifying diet, do lifestyle changes, weight loss. Sodium restriction recommended. She admits to intake of a lot of prepackaged noodles that may be high in sodium. patient to report to our office if blood pressures persistently greater than 140/80. She still has some labetalol pills at home. May need to start at 100 mg twice a day, depending on her measurements. Eventually, depending on how she responds to treatment, may need workup for secondary causes of hypertension.  Current medicines are reviewed at length with the patient today.  The patient does not have concerns regarding medicines.  Labs/ tests ordered today include:  Orders Placed This Encounter  Procedures  . EKG 12-Lead    I had a lengthy and detailed discussion with the patient regarding diagnoses, prognosis, diagnostic options, treatment options , and side effects of medications.   I counseled the patient on importance of lifestyle modification including heart healthy diet, regular physical activity.   Disposition:   FU with undersigned in 1-2 months  I spent at least 45minutes with the patient today and more than 50% of the time was spent counseling the patient and coordinating care.    Signed, Almond LintAileen Alannis Hsia, MD  07/29/2016 2:46 PM    [facility]  This note was generated in part  with voice recognition software and I apologize for any typographical errors that were not detected and corrected.

## 2016-09-01 ENCOUNTER — Encounter: Payer: Self-pay | Admitting: *Deleted

## 2016-09-01 ENCOUNTER — Ambulatory Visit: Payer: Medicaid Other | Admitting: Cardiology

## 2017-03-17 IMAGING — US US MFM OB FOLLOW-UP
1 series · 14 of 28 positions shown · non-contrast
Comparison: none

[Series 1: us mfm ob follow-up · 0.19mm/px · 14 of 40 slices shown]
[im 2/40]
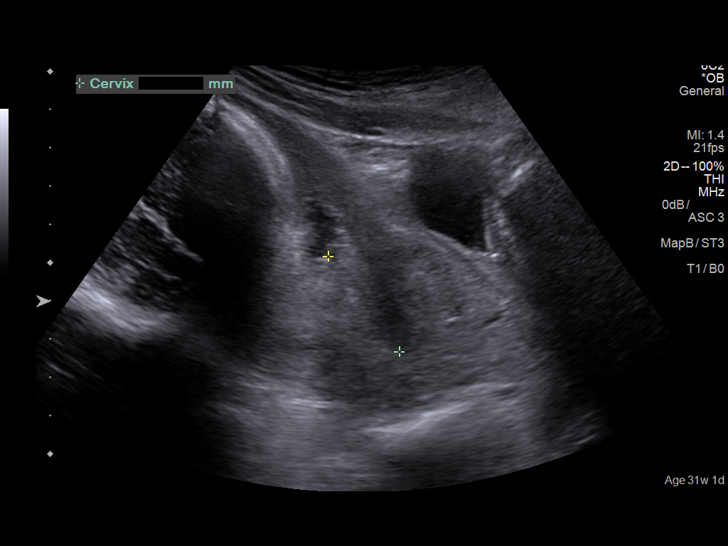
[im 5/40]
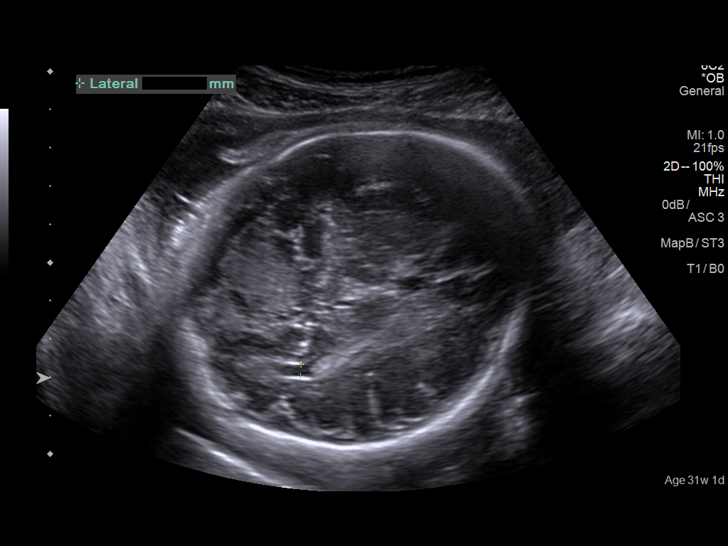
[im 8/40]
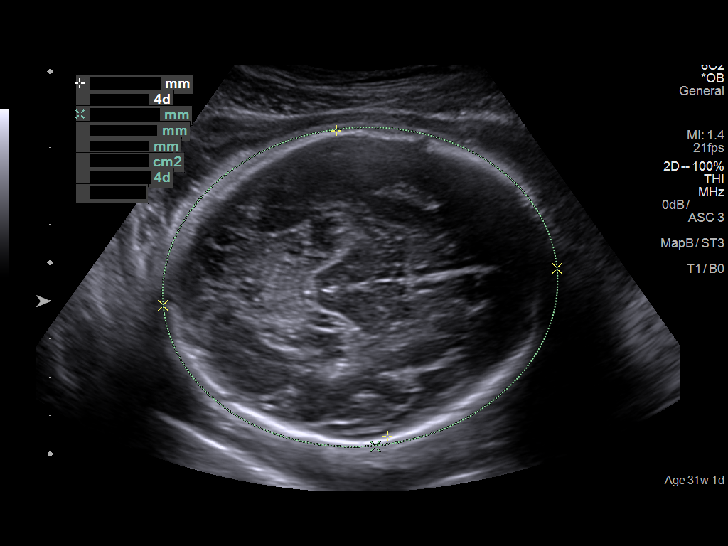
[im 11/40]
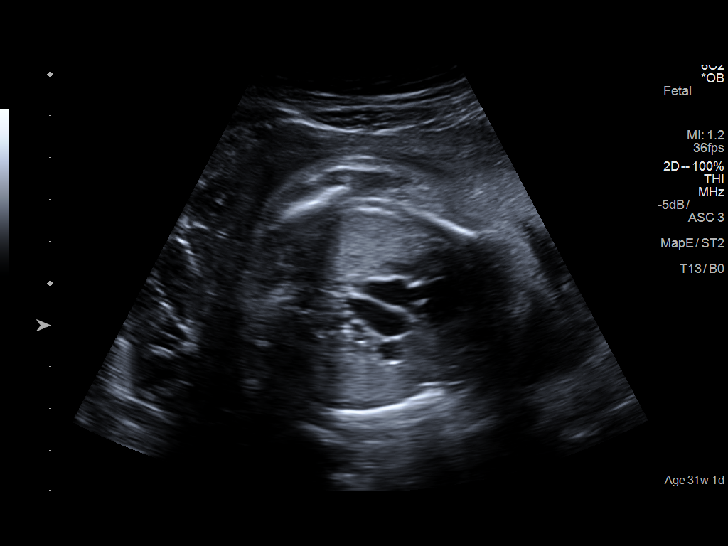
[im 14/40]
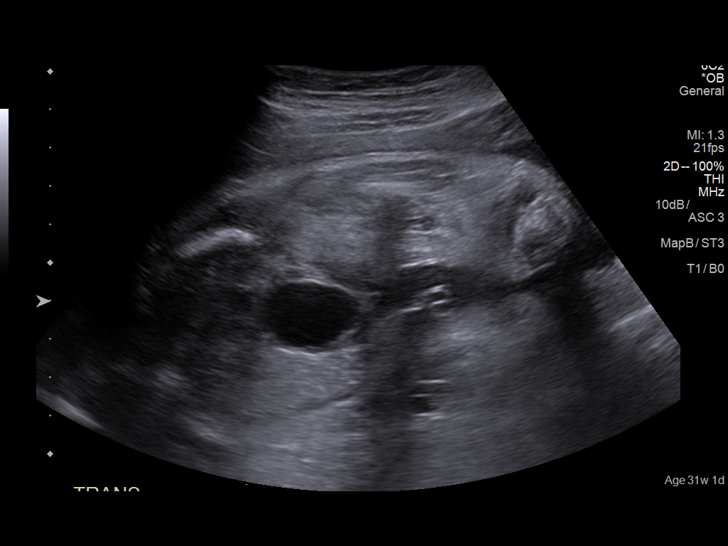
[im 16/40]
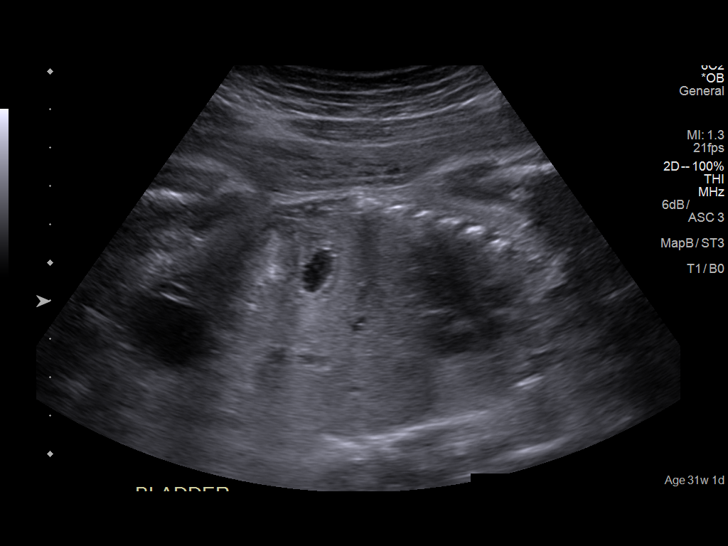
[im 19/40]
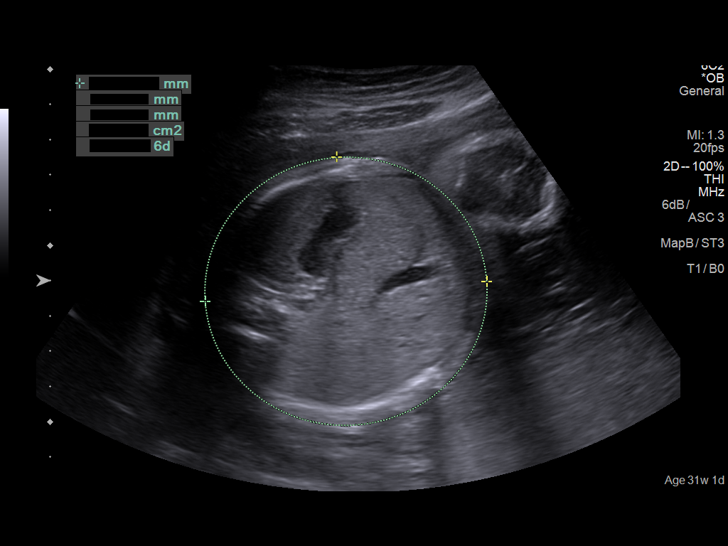
[im 22/40]
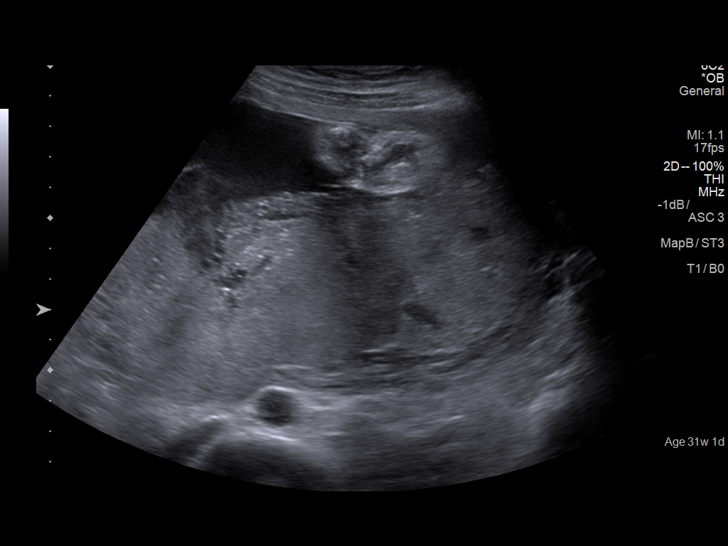
[im 25/40]
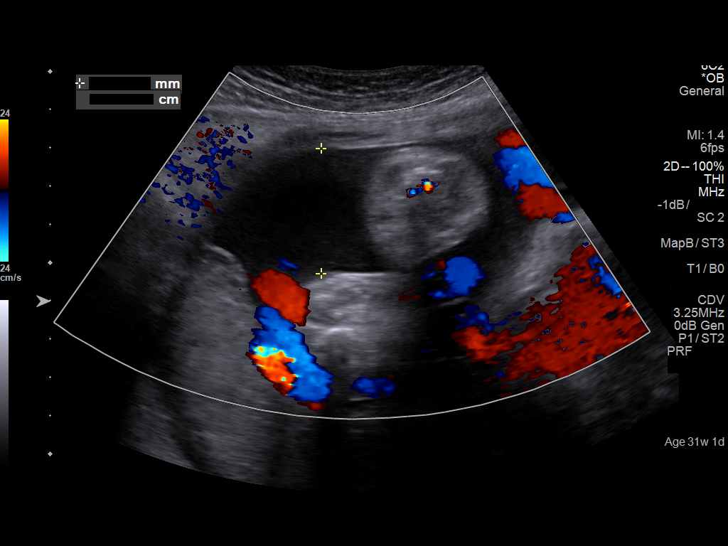
[im 28/40]
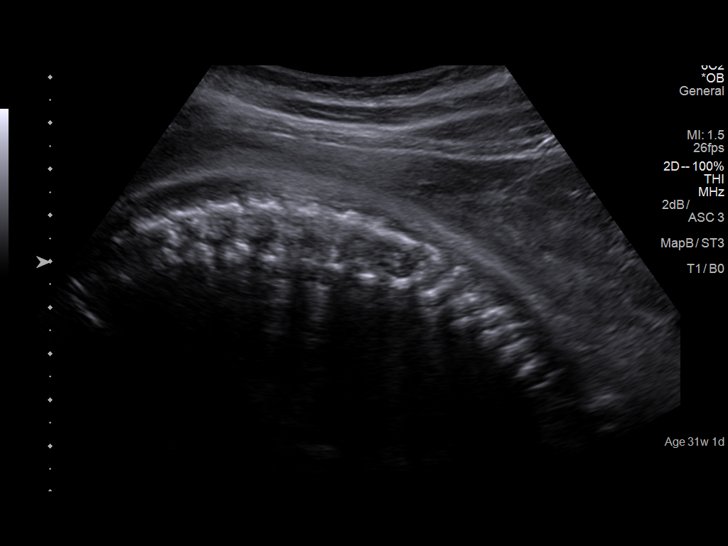
[im 31/40]
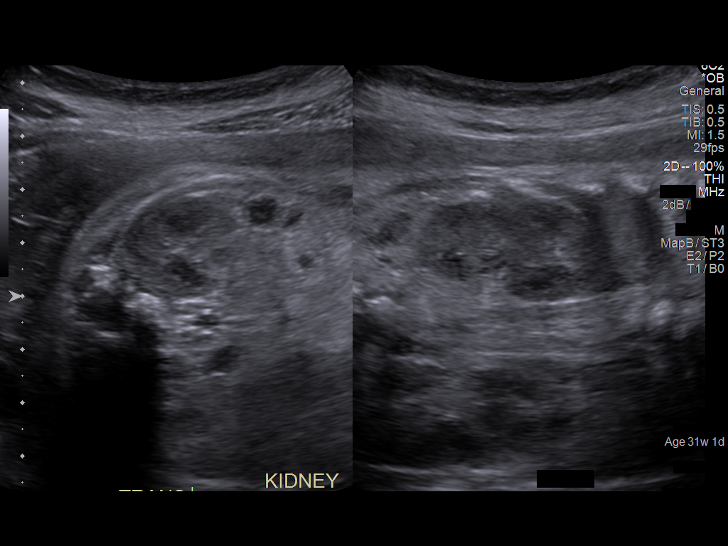
[im 34/40]
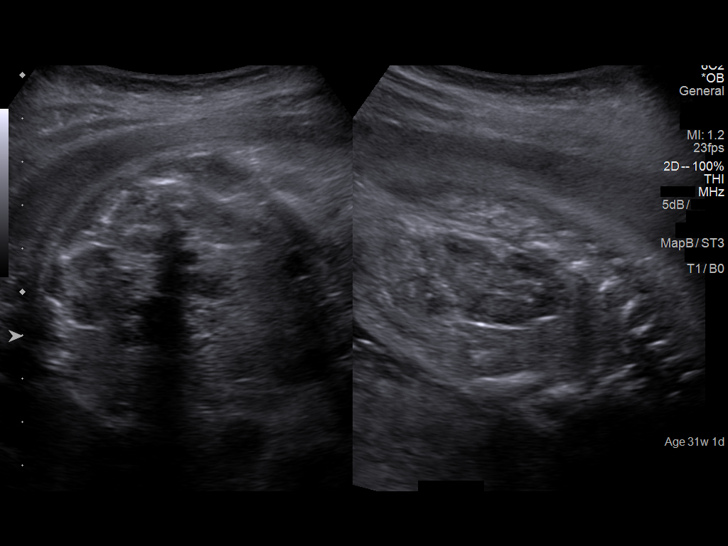
[im 37/40]
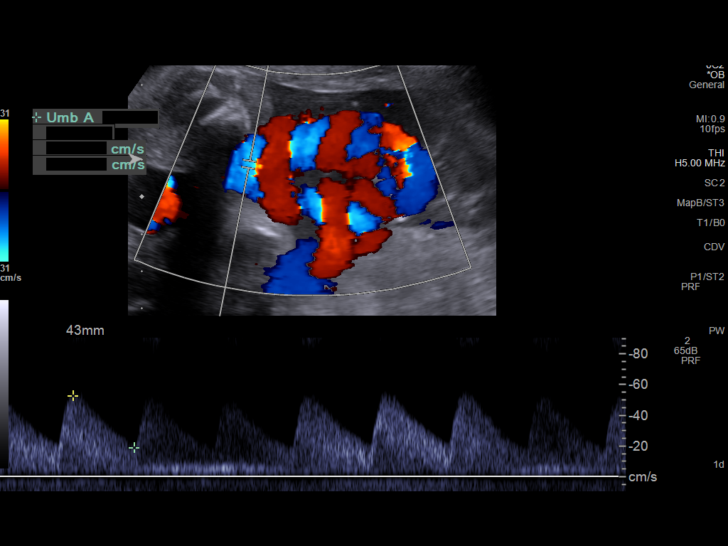
[im 40/40]
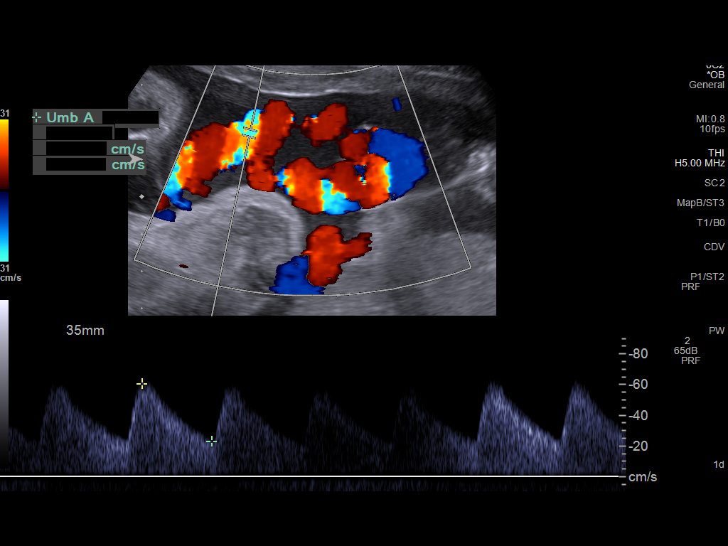

[14 of 28 positions shown; findings below may reference images not displayed]

Canned report from images found in remote index.

Refer to host system for actual result text.

## 2018-05-05 ENCOUNTER — Encounter: Payer: Self-pay | Admitting: Emergency Medicine

## 2018-05-05 ENCOUNTER — Other Ambulatory Visit: Payer: Self-pay

## 2018-05-05 ENCOUNTER — Emergency Department
Admission: EM | Admit: 2018-05-05 | Discharge: 2018-05-05 | Disposition: A | Discharge status: Home / Self Care | Payer: Self-pay | Attending: Emergency Medicine | Admitting: Emergency Medicine

## 2018-05-05 DIAGNOSIS — I10 Essential (primary) hypertension: Secondary | ICD-10-CM | POA: Insufficient documentation

## 2018-05-05 DIAGNOSIS — Z79899 Other long term (current) drug therapy: Secondary | ICD-10-CM | POA: Insufficient documentation

## 2018-05-05 DIAGNOSIS — N390 Urinary tract infection, site not specified: Secondary | ICD-10-CM | POA: Insufficient documentation

## 2018-05-05 DIAGNOSIS — R42 Dizziness and giddiness: Secondary | ICD-10-CM | POA: Insufficient documentation

## 2018-05-05 HISTORY — DX: Essential (primary) hypertension: I10

## 2018-05-05 LAB — URINALYSIS, COMPLETE (UACMP) WITH MICROSCOPIC
Bilirubin Urine: NEGATIVE
Glucose, UA: NEGATIVE mg/dL
Hgb urine dipstick: NEGATIVE
Ketones, ur: 20 mg/dL — AB
Nitrite: NEGATIVE
PROTEIN: 100 mg/dL — AB
SPECIFIC GRAVITY, URINE: 1.019 (ref 1.005–1.030)
pH: 9 — ABNORMAL HIGH (ref 5.0–8.0)

## 2018-05-05 LAB — CBC
HEMATOCRIT: 29.5 % — AB (ref 35.0–47.0)
HEMOGLOBIN: 9 g/dL — AB (ref 12.0–16.0)
MCH: 19.4 pg — AB (ref 26.0–34.0)
MCHC: 30.4 g/dL — AB (ref 32.0–36.0)
MCV: 63.7 fL — ABNORMAL LOW (ref 80.0–100.0)
Platelets: 310 10*3/uL (ref 150–440)
RBC: 4.63 MIL/uL (ref 3.80–5.20)
RDW: 16.6 % — AB (ref 11.5–14.5)
WBC: 15 10*3/uL — ABNORMAL HIGH (ref 3.6–11.0)

## 2018-05-05 LAB — COMPREHENSIVE METABOLIC PANEL
ALT: 14 U/L (ref 0–44)
AST: 26 U/L (ref 15–41)
Albumin: 4.6 g/dL (ref 3.5–5.0)
Alkaline Phosphatase: 64 U/L (ref 38–126)
Anion gap: 10 (ref 5–15)
BUN: 8 mg/dL (ref 6–20)
CO2: 20 mmol/L — AB (ref 22–32)
Calcium: 9.3 mg/dL (ref 8.9–10.3)
Chloride: 105 mmol/L (ref 98–111)
Creatinine, Ser: 0.66 mg/dL (ref 0.44–1.00)
GLUCOSE: 106 mg/dL — AB (ref 70–99)
POTASSIUM: 3.5 mmol/L (ref 3.5–5.1)
Sodium: 135 mmol/L (ref 135–145)
TOTAL PROTEIN: 8.5 g/dL — AB (ref 6.5–8.1)
Total Bilirubin: 0.7 mg/dL (ref 0.3–1.2)

## 2018-05-05 LAB — TSH: TSH: 0.463 u[IU]/mL (ref 0.350–4.500)

## 2018-05-05 LAB — LIPASE, BLOOD: LIPASE: 24 U/L (ref 11–51)

## 2018-05-05 LAB — POCT PREGNANCY, URINE: Preg Test, Ur: NEGATIVE

## 2018-05-05 MED ORDER — ONDANSETRON 4 MG PO TBDP
4.0000 mg | ORAL_TABLET | Freq: Once | ORAL | Status: AC | PRN
  Administered 2018-05-05: 4 mg via ORAL
  Filled 2018-05-05: qty 1

## 2018-05-05 MED ORDER — SODIUM CHLORIDE 0.9 % IV BOLUS
1000.0000 mL | Freq: Once | INTRAVENOUS | Status: AC
  Administered 2018-05-05: 1000 mL via INTRAVENOUS

## 2018-05-05 MED ORDER — CEPHALEXIN 500 MG PO CAPS: 500.0000 mg | ORAL_CAPSULE | Freq: Three times a day (TID) | ORAL | 0 refills | Status: DC

## 2018-05-05 MED ORDER — KETOROLAC TROMETHAMINE 30 MG/ML IJ SOLN
30.0000 mg | Freq: Once | INTRAMUSCULAR | Status: AC
  Administered 2018-05-05: 30 mg via INTRAVENOUS
  Filled 2018-05-05: qty 1

## 2018-05-05 NOTE — ED Notes (Signed)
Pt verbalizes understanding of discharge instructions.

## 2018-05-05 NOTE — ED Provider Notes (Signed)
[facility] Emergency Department Provider Note  ____________________________________________   I have reviewed the triage vital signs and the nursing notes.   HISTORY  Chief Complaint Dizziness and Generalized Body Aches   History limited by: Not Limited   HPI Charlene Daniel is a 27 y.o. female who presents to the emergency department today with multiple medical complaints.  She is complaining of a feeling of dizziness, she is also complaining of abdominal pain and nausea as well as body aches.  Patient states the symptoms have been going on for the past few days.  They have gradually been getting worse.  She describes the pain as being located throughout her abdomen and her lower extremities.  She states the pain is so severe it makes it hard for her to walk.  She has also had some nausea.  She has had vomiting but denies any bloody vomit.  Denies any bloody stool.  She denies any known recent sick contacts.  Denies similar pain in the past.  In addition she does state that she is lost about 30 pounds in the past 2 months.  She states that she has been without significant appetite.    Per medical record review patient has a history of hypertension  Past Medical History:  Diagnosis Date  . Anemia   . Arm fracture, left 2016  . Hypertension   . Osteogenesis imperfecta    Type I    Patient Active Problem List   Diagnosis Date Noted  . Irregular contractions 07/21/2015  . Poor fetal growth affecting management of mother in third trimester, antepartum 06/30/2015  . Osteogenesis imperfecta 04/07/2015    Past Surgical History:  Procedure Laterality Date  . arm surger      Prior to Admission medications   Medication Sig Start Date End Date Taking? Authorizing Provider  Ferrous Fumarate (HEMOCYTE - 106 MG FE) 324 (106 Fe) MG TABS tablet Take 1 tablet by mouth.    [provider]  labetalol (NORMODYNE) 200 MG tablet Take 1 tablet (200 mg total)  by mouth 2 (two) times daily. Patient not taking: Reported on 07/29/2016 07/22/15   Nadara Mustard, MD    Allergies Patient has no known allergies.  No family history on file.  Social History Social History   Tobacco Use  . Smoking status: Never Smoker  . Smokeless tobacco: Never Used  Substance Use Topics  . Alcohol use: No    Comment: socially  . Drug use: No    Review of Systems Constitutional: No fever/chills Eyes: No visual changes. ENT: No sore throat. Cardiovascular: Denies chest pain. Respiratory: Denies shortness of breath. Gastrointestinal: Positive for abdominal pain, nausea and vomiting Genitourinary: Negative for dysuria. Musculoskeletal: Negative for back pain. Skin: Negative for rash. Neurological: Positive for dizziness and lightheadedness ____________________________________________   PHYSICAL EXAM:  VITAL SIGNS: ED Triage Vitals  Enc Vitals Group     BP 05/05/18 1646 (!) 150/100     Pulse Rate 05/05/18 1646 (!) 102     Resp 05/05/18 1646 20     Temp 05/05/18 1645 (!) 100.4 F (38 C)     Temp Source 05/05/18 1645 Oral     SpO2 05/05/18 1646 99 %     Weight 05/05/18 1645 125 lb (56.7 kg)     Height 05/05/18 1645 5\' 2"  (1.575 m)     Head Circumference --      Peak Flow --      Pain Score 05/05/18 1645 10  Constitutional: Alert and oriented.  Eyes: Conjunctivae are normal.  ENT      Head: Normocephalic and atraumatic.      Nose: No congestion/rhinnorhea.      Mouth/Throat: Mucous membranes are moist.      Neck: No stridor. Hematological/Lymphatic/Immunilogical: No cervical lymphadenopathy. Cardiovascular: Tachycardic, regular rhythm.  No murmurs, rubs, or gallops.  Respiratory: Normal respiratory effort without tachypnea nor retractions. Breath sounds are clear and equal bilaterally. No wheezes/rales/rhonchi. Gastrointestinal: Soft and non tender. No rebound. No guarding.  Genitourinary: Deferred Musculoskeletal: Normal range of motion  in all extremities. No lower extremity edema. Neurologic:  Normal speech and language. No gross focal neurologic deficits are appreciated.  Skin:  Skin is warm, dry and intact. No rash noted. Psychiatric: Mood and affect are normal. Speech and behavior are normal. Patient exhibits appropriate insight and judgment.  ____________________________________________    LABS (pertinent positives/negatives)  CBC wbc 15.0, hgb 9.0, plt 310 CMP na 135, k 3.5, glu 106, cr 0.66 Lipase 24 Upreg neg UA 11-20 wbc, small leukocytes TSH 0.463 ____________________________________________   EKG  I, Phineas Semen, attending physician, personally viewed and interpreted this EKG  EKG Time: 1651 Rate: 110 Rhythm: sinus tachycardia Axis: rightward axis Intervals: qtc 446 QRS: no st elevation ST changes: no st elevation Impression: abnormal ekg   ____________________________________________    RADIOLOGY  None  ____________________________________________   PROCEDURES  Procedures  ____________________________________________   INITIAL IMPRESSION / ASSESSMENT AND PLAN / ED COURSE  Pertinent labs & imaging results that were available during my care of the patient were reviewed by me and considered in my medical decision making (see chart for details).  Patient presented with symptoms including dizziness, abdominal pain nausea and body aches.  Patient was initially slightly tachycardic.  Given that the patient was also complaining of weight loss did have concerns for possible hyperthyroid.  TSH was normal.  Blood work and urine was also sent to evaluate for etiology.  Patient was found to have possible urinary tract infection.  Discussed this with the patient.  Patient did feel better after IV fluids and medication.  Will discharge with antibiotics.  Discussed importance of primary care follow-up.   ____________________________________________   FINAL CLINICAL IMPRESSION(S) / ED  DIAGNOSES  Final diagnoses:  Dizziness  Lower urinary tract infectious disease     Note: This dictation was prepared with Dragon dictation. Any transcriptional errors that result from this process are unintentional     Phineas Semen, MD 05/05/18 1929

## 2018-05-05 NOTE — ED Triage Notes (Addendum)
Pt reports lightheadedness, generalized body aches all over (mostly bilateral legs, abdomen and back), pain upon walking, shortness of breath that began this morning. In triage, pt stated "I'm not sure if I'm being dramatic, but I'm feeling my heart flutter."   Also c/o right arm "popping in and out of socket" daily x months. States her PCP is aware and just "because she's getting older."   Pt has lost approx 30lbs in the last 2 months, unintentionally.

## 2018-05-05 NOTE — Discharge Instructions (Addendum)
Please seek medical attention for any high fevers, chest pain, shortness of breath, change in behavior, persistent vomiting, bloody stool or any other new or concerning symptoms.  

## 2020-02-20 DIAGNOSIS — Z0000 Encounter for general adult medical examination without abnormal findings: Secondary | ICD-10-CM | POA: Diagnosis not present

## 2020-02-20 DIAGNOSIS — Z113 Encounter for screening for infections with a predominantly sexual mode of transmission: Secondary | ICD-10-CM | POA: Diagnosis not present

## 2020-06-06 DIAGNOSIS — Z130 Encounter for screening for diseases of the blood and blood-forming organs and certain disorders involving the immune mechanism: Secondary | ICD-10-CM | POA: Diagnosis not present

## 2020-06-06 DIAGNOSIS — D649 Anemia, unspecified: Secondary | ICD-10-CM | POA: Diagnosis not present

## 2020-06-06 DIAGNOSIS — Z113 Encounter for screening for infections with a predominantly sexual mode of transmission: Secondary | ICD-10-CM | POA: Diagnosis not present

## 2020-06-06 DIAGNOSIS — Z0000 Encounter for general adult medical examination without abnormal findings: Secondary | ICD-10-CM | POA: Diagnosis not present

## 2020-07-14 DIAGNOSIS — Z0000 Encounter for general adult medical examination without abnormal findings: Secondary | ICD-10-CM | POA: Diagnosis not present

## 2020-07-14 DIAGNOSIS — D509 Iron deficiency anemia, unspecified: Secondary | ICD-10-CM | POA: Diagnosis not present

## 2020-07-14 DIAGNOSIS — D649 Anemia, unspecified: Secondary | ICD-10-CM | POA: Diagnosis not present

## 2020-07-14 DIAGNOSIS — Z1389 Encounter for screening for other disorder: Secondary | ICD-10-CM | POA: Diagnosis not present

## 2020-07-14 DIAGNOSIS — Z3200 Encounter for pregnancy test, result unknown: Secondary | ICD-10-CM | POA: Diagnosis not present

## 2020-09-23 DIAGNOSIS — D649 Anemia, unspecified: Secondary | ICD-10-CM | POA: Diagnosis not present

## 2020-09-23 DIAGNOSIS — D509 Iron deficiency anemia, unspecified: Secondary | ICD-10-CM | POA: Diagnosis not present

## 2020-10-07 DIAGNOSIS — D509 Iron deficiency anemia, unspecified: Secondary | ICD-10-CM | POA: Diagnosis not present

## 2021-11-13 DIAGNOSIS — Z113 Encounter for screening for infections with a predominantly sexual mode of transmission: Secondary | ICD-10-CM | POA: Diagnosis not present

## 2021-11-13 DIAGNOSIS — Z0000 Encounter for general adult medical examination without abnormal findings: Secondary | ICD-10-CM | POA: Diagnosis not present

## 2021-11-13 DIAGNOSIS — Q780 Osteogenesis imperfecta: Secondary | ICD-10-CM | POA: Diagnosis not present

## 2021-11-13 DIAGNOSIS — D509 Iron deficiency anemia, unspecified: Secondary | ICD-10-CM | POA: Diagnosis not present

## 2021-11-13 DIAGNOSIS — Z1159 Encounter for screening for other viral diseases: Secondary | ICD-10-CM | POA: Diagnosis not present

## 2021-11-13 DIAGNOSIS — R109 Unspecified abdominal pain: Secondary | ICD-10-CM | POA: Diagnosis not present

## 2021-12-18 DIAGNOSIS — A749 Chlamydial infection, unspecified: Secondary | ICD-10-CM | POA: Diagnosis not present

## 2021-12-18 DIAGNOSIS — Z0000 Encounter for general adult medical examination without abnormal findings: Secondary | ICD-10-CM | POA: Diagnosis not present

## 2022-03-09 ENCOUNTER — Other Ambulatory Visit: Payer: Self-pay

## 2022-03-09 ENCOUNTER — Emergency Department
Admission: EM | Admit: 2022-03-09 | Discharge: 2022-03-09 | Disposition: A | Discharge status: Home / Self Care | Payer: Medicaid Other | Attending: Emergency Medicine | Admitting: Emergency Medicine

## 2022-03-09 ENCOUNTER — Encounter: Payer: Self-pay | Admitting: Emergency Medicine

## 2022-03-09 DIAGNOSIS — E876 Hypokalemia: Secondary | ICD-10-CM | POA: Insufficient documentation

## 2022-03-09 DIAGNOSIS — R531 Weakness: Secondary | ICD-10-CM

## 2022-03-09 DIAGNOSIS — D649 Anemia, unspecified: Secondary | ICD-10-CM | POA: Insufficient documentation

## 2022-03-09 LAB — CBC
HCT: 27.6 % — ABNORMAL LOW (ref 36.0–46.0)
Hemoglobin: 7.7 g/dL — ABNORMAL LOW (ref 12.0–15.0)
MCH: 19.2 pg — ABNORMAL LOW (ref 26.0–34.0)
MCHC: 27.9 g/dL — ABNORMAL LOW (ref 30.0–36.0)
MCV: 68.8 fL — ABNORMAL LOW (ref 80.0–100.0)
Platelets: 212 K/uL (ref 150–400)
RBC: 4.01 MIL/uL (ref 3.87–5.11)
RDW: 16.2 % — ABNORMAL HIGH (ref 11.5–15.5)
WBC: 3.6 K/uL — ABNORMAL LOW (ref 4.0–10.5)
nRBC: 0 % (ref 0.0–0.2)

## 2022-03-09 LAB — BASIC METABOLIC PANEL WITH GFR
Anion gap: 7 (ref 5–15)
BUN: 9 mg/dL (ref 6–20)
CO2: 26 mmol/L (ref 22–32)
Calcium: 9.1 mg/dL (ref 8.9–10.3)
Chloride: 105 mmol/L (ref 98–111)
Creatinine, Ser: 0.64 mg/dL (ref 0.44–1.00)
GFR, Estimated: >60 mL/min
Glucose, Bld: 85 mg/dL (ref 70–99)
Potassium: 2.9 mmol/L — ABNORMAL LOW (ref 3.5–5.1)
Sodium: 138 mmol/L (ref 135–145)

## 2022-03-09 LAB — URINALYSIS, ROUTINE W REFLEX MICROSCOPIC
Bilirubin Urine: NEGATIVE
Glucose, UA: NEGATIVE mg/dL
Ketones, ur: NEGATIVE mg/dL
Nitrite: NEGATIVE
Protein, ur: 30 mg/dL — AB
Specific Gravity, Urine: 1.024 (ref 1.005–1.030)
pH: 5 (ref 5.0–8.0)

## 2022-03-09 LAB — TSH: TSH: 0.976 u[IU]/mL (ref 0.350–4.500)

## 2022-03-09 MED ORDER — POTASSIUM CHLORIDE CRYS ER 20 MEQ PO TBCR
40.0000 meq | EXTENDED_RELEASE_TABLET | Freq: Once | ORAL | Status: AC
  Administered 2022-03-09: 40 meq via ORAL
  Filled 2022-03-09: qty 2

## 2022-03-09 MED ORDER — IRON 325 (65 FE) MG PO TABS: 1.0000 | ORAL_TABLET | Freq: Every day | ORAL | 2 refills | Status: DC

## 2022-03-09 MED ORDER — SODIUM CHLORIDE 0.9 % IV BOLUS
1000.0000 mL | Freq: Once | INTRAVENOUS | Status: AC
  Administered 2022-03-09: 1000 mL via INTRAVENOUS

## 2022-03-09 NOTE — ED Provider Notes (Signed)
Procedures     ----------------------------------------- 4:47 PM on 03/09/2022 -----------------------------------------  Patient is feeling better, normal mental status.  Ambulatory without lightheadedness.  Agreeable for discharge home and follow-up with hematology.  She reports that she has iron supplements at home but has not been taking them reliably, and she will try to improve compliance with that.  I also counseled her on potassium containing foods.    Sharman Cheek, MD 03/09/22 [phone]

## 2022-03-09 NOTE — ED Triage Notes (Signed)
Patient to ED via POV for generalized weakness. Patient states she just had a period and is anemic. Reports symptoms of hot flashes, cramps, and vomiting.

## 2022-03-09 NOTE — Discharge Instructions (Addendum)
Please seek medical attention for any high fevers, chest pain, shortness of breath, change in behavior, persistent vomiting, bloody stool or any other new or concerning symptoms.  

## 2022-03-09 NOTE — ED Notes (Signed)
Pt A&O, IV removed, pt given discharge instructions, pt ambulating with steady gait. 

## 2022-03-09 NOTE — ED Provider Notes (Signed)
[facility] Provider Note    Event Date/Time   First MD Initiated Contact with Patient 03/09/22 1417     (approximate)   History   Weakness   HPI  Charlene Daniel is a 31 y.o. female  who presents to the emergency department today because of concerns for fatigue.  The patient states that she has a known history of anemia.  She usually gets fatigue after her periods although it then improves.  Patient states that she feels like this has not gotten any better.  Does not regularly take iron although states she bought some over-the-counter.  Additionally the patient has been having some right flank pain which she states is related to her period.  She denies any current right flank pain. Has also been having decreased oral intake with the weakness.     Physical Exam   Triage Vital Signs: ED Triage Vitals  Enc Vitals Group     BP 03/09/22 1137 132/83     Pulse Rate 03/09/22 1137 79     Resp 03/09/22 1137 18     Temp 03/09/22 1137 98.2 F (36.8 C)     Temp Source 03/09/22 1137 Oral     SpO2 03/09/22 1137 100 %     Weight 03/09/22 1138 112 lb (50.8 kg)     Height 03/09/22 1138 5\' 2"  (1.575 m)     Head Circumference --      Peak Flow --      Pain Score 03/09/22 1137 0     Pain Loc --      Pain Edu? --      Excl. in GC? --     Most recent vital signs: Vitals:   03/09/22 1137  BP: 132/83  Pulse: 79  Resp: 18  Temp: 98.2 F (36.8 C)  SpO2: 100%    General: Awake, alert, oriented. CV:  Good peripheral perfusion. Regular rate and rhythm. No m/r/g. Resp:  Normal effort. Lungs clear to auscultation. Abd:  No distention. Non tender.  ED Results / Procedures / Treatments   Labs (all labs ordered are listed, but only abnormal results are displayed) Labs Reviewed  BASIC METABOLIC PANEL - Abnormal; Notable for the following components:      Result Value   Potassium 2.9 (*)    All other components within normal limits  CBC - Abnormal; Notable for  the following components:   WBC 3.6 (*)    Hemoglobin 7.7 (*)    HCT 27.6 (*)    MCV 68.8 (*)    MCH 19.2 (*)    MCHC 27.9 (*)    RDW 16.2 (*)    All other components within normal limits  URINALYSIS, ROUTINE W REFLEX MICROSCOPIC - Abnormal; Notable for the following components:   Color, Urine YELLOW (*)    APPearance CLOUDY (*)    Hgb urine dipstick SMALL (*)    Protein, ur 30 (*)    Leukocytes,Ua MODERATE (*)    Bacteria, UA MANY (*)    All other components within normal limits  URINE CULTURE  TSH  CBG MONITORING, ED  POC URINE PREG, ED     EKG  I, 05/10/22, attending physician, personally viewed and interpreted this EKG  EKG Time: 1142 Rate: 61 Rhythm: normal sinus rhythm Axis: normal Intervals: qtc 392 QRS: narrow ST changes: no st elevation Impression: normal ekg  RADIOLOGY None   PROCEDURES:  Critical Care performed: No  Procedures   MEDICATIONS ORDERED  IN ED: Medications  potassium chloride SA (KLOR-CON M) CR tablet 40 mEq (has no administration in time range)  sodium chloride 0.9 % bolus 1,000 mL (has no administration in time range)     IMPRESSION / MDM / ASSESSMENT AND PLAN / ED COURSE  I reviewed the triage vital signs and the nursing notes.                              Differential diagnosis includes, but is not limited to, anemia, electrolyte abnormality, thyroid disorder.  Patient's presentation is most consistent with acute presentation with potential threat to life or bodily function.  Patient presented to the emergency department today because of concerns for weakness.  On exam patient is neither tachycardic nor hypotensive.  Hemoglobin is low at 7.7.  On chart review however patient appears to have chronic anemia.  Per [facility] hematology note dated 09/23/20 "Hgb has been in the 7-8s since 2019".  At this time while patient is certainly anemic I do not feel she requires emergent blood transfusion.  Patient vital signs without  concerning abnormality.  I did discuss with patient portance of taking iron regularly.  Additionally will give patient hematology follow-up information.  Furthermore the patient's potassium level was low today.  This could also be contributing the patient's symptoms.  This might be related to the patient's decreased oral intake.  Will give patient potassium here in the emergency department.  We will also check TSH given reported weight loss although have low suspicion that significant thyroid disorder is present.  FINAL CLINICAL IMPRESSION(S) / ED DIAGNOSES   Final diagnoses:  Weakness  Anemia, unspecified type  Hypokalemia      Note:  This document was prepared using Dragon voice recognition software and may include unintentional dictation errors.    Phineas Semen, MD 03/09/22 [phone]

## 2022-03-10 LAB — URINE CULTURE: Culture: 20000 — AB

## 2022-05-10 DIAGNOSIS — S63437A Traumatic rupture of volar plate of left little finger at metacarpophalangeal and interphalangeal joint, initial encounter: Secondary | ICD-10-CM | POA: Diagnosis not present

## 2022-05-10 DIAGNOSIS — Q780 Osteogenesis imperfecta: Secondary | ICD-10-CM | POA: Diagnosis not present

## 2022-06-28 DIAGNOSIS — Z1159 Encounter for screening for other viral diseases: Secondary | ICD-10-CM | POA: Diagnosis not present

## 2022-06-28 DIAGNOSIS — Z113 Encounter for screening for infections with a predominantly sexual mode of transmission: Secondary | ICD-10-CM | POA: Diagnosis not present

## 2022-06-28 DIAGNOSIS — D509 Iron deficiency anemia, unspecified: Secondary | ICD-10-CM | POA: Diagnosis not present

## 2022-06-28 DIAGNOSIS — Z1389 Encounter for screening for other disorder: Secondary | ICD-10-CM | POA: Diagnosis not present

## 2022-06-28 DIAGNOSIS — R030 Elevated blood-pressure reading, without diagnosis of hypertension: Secondary | ICD-10-CM | POA: Diagnosis not present

## 2022-06-28 DIAGNOSIS — Z3009 Encounter for other general counseling and advice on contraception: Secondary | ICD-10-CM | POA: Diagnosis not present

## 2022-07-13 ENCOUNTER — Ambulatory Visit: Payer: Self-pay | Admitting: Family Medicine

## 2022-09-14 DIAGNOSIS — R42 Dizziness and giddiness: Secondary | ICD-10-CM | POA: Diagnosis not present

## 2022-09-14 DIAGNOSIS — Z130 Encounter for screening for diseases of the blood and blood-forming organs and certain disorders involving the immune mechanism: Secondary | ICD-10-CM | POA: Diagnosis not present

## 2022-09-14 DIAGNOSIS — D509 Iron deficiency anemia, unspecified: Secondary | ICD-10-CM | POA: Diagnosis not present

## 2022-09-14 DIAGNOSIS — Z113 Encounter for screening for infections with a predominantly sexual mode of transmission: Secondary | ICD-10-CM | POA: Diagnosis not present

## 2022-09-14 DIAGNOSIS — Z1389 Encounter for screening for other disorder: Secondary | ICD-10-CM | POA: Diagnosis not present

## 2022-09-14 DIAGNOSIS — Z1159 Encounter for screening for other viral diseases: Secondary | ICD-10-CM | POA: Diagnosis not present

## 2022-10-25 NOTE — Progress Notes (Deleted)
    New patient visit   Patient: Charlene Daniel   DOB: 11-12-1990   32 y.o. Female  MRN: [id number] Visit Date: 10/26/2022  Today's healthcare provider: Eulis Foster, MD   No chief complaint on file.  Subjective    Charlene Daniel is a 32 y.o. female who presents today as a new patient to establish care.  HPI  ***  Past Medical History:  Diagnosis Date   Anemia    Arm fracture, left 2016   Hypertension    Osteogenesis imperfecta    Type I   Past Surgical History:  Procedure Laterality Date   arm surger     No family status information on file.   No family history on file. Social History   Socioeconomic History   Marital status: Single    Spouse name: Not on file   Number of children: Not on file   Years of education: Not on file   Highest education level: Not on file  Occupational History   Not on file  Tobacco Use   Smoking status: Never   Smokeless tobacco: Never  Substance and Sexual Activity   Alcohol use: No    Comment: socially   Drug use: No   Sexual activity: Yes  Other Topics Concern   Not on file  Social History Narrative   Not on file   Social Determinants of Health   Financial Resource Strain: Not on file  Food Insecurity: Not on file  Transportation Needs: Not on file  Physical Activity: Not on file  Stress: Not on file  Social Connections: Not on file   Outpatient Medications Prior to Visit  Medication Sig   cephALEXin (KEFLEX) 500 MG capsule Take 1 capsule (500 mg total) by mouth 3 (three) times daily.   Ferrous Fumarate (HEMOCYTE - 106 MG FE) 324 (106 Fe) MG TABS tablet Take 1 tablet by mouth.   Ferrous Sulfate (IRON) 325 (65 Fe) MG TABS Take 1 tablet (325 mg total) by mouth daily.   labetalol (NORMODYNE) 200 MG tablet Take 1 tablet (200 mg total) by mouth 2 (two) times daily. (Patient not taking: Reported on 07/29/2016)   No facility-administered medications prior to visit.   No Known Allergies   There is no  immunization history on file for this patient.  Health Maintenance  Topic Date Due   COVID-19 Vaccine (1) Never done   Hepatitis C Screening  Never done   DTaP/Tdap/Td (1 - Tdap) Never done   PAP SMEAR-Modifier  Never done   INFLUENZA VACCINE  Never done   HIV Screening  Completed   HPV VACCINES  Aged Out    Patient Care Team: Center, [facility] as PCP - General  Review of Systems  {Labs  Heme  Chem  Endocrine  Serology  Results Review (optional):23779}   Objective    There were no vitals taken for this visit. {Show previous vital signs (optional):23777}  Physical Exam ***  Depression Screen     No data to display         No results found for any visits on 10/26/22.  Assessment & Plan     ***  No follow-ups on file.     {provider attestation***:1}   Eulis Foster, MD  [facility] [phone] (phone) [phone] (fax)  [place]

## 2022-10-26 ENCOUNTER — Ambulatory Visit: Payer: Medicaid Other | Admitting: Family Medicine

## 2022-11-27 DIAGNOSIS — Z789 Other specified health status: Secondary | ICD-10-CM | POA: Diagnosis not present

## 2023-02-07 DIAGNOSIS — Z3200 Encounter for pregnancy test, result unknown: Secondary | ICD-10-CM | POA: Diagnosis not present

## 2023-02-07 DIAGNOSIS — Z1159 Encounter for screening for other viral diseases: Secondary | ICD-10-CM | POA: Diagnosis not present

## 2023-02-07 DIAGNOSIS — Z1389 Encounter for screening for other disorder: Secondary | ICD-10-CM | POA: Diagnosis not present

## 2023-02-07 DIAGNOSIS — Z3202 Encounter for pregnancy test, result negative: Secondary | ICD-10-CM | POA: Diagnosis not present

## 2023-02-07 DIAGNOSIS — Z131 Encounter for screening for diabetes mellitus: Secondary | ICD-10-CM | POA: Diagnosis not present

## 2023-02-07 DIAGNOSIS — D509 Iron deficiency anemia, unspecified: Secondary | ICD-10-CM | POA: Diagnosis not present

## 2023-02-07 DIAGNOSIS — Z113 Encounter for screening for infections with a predominantly sexual mode of transmission: Secondary | ICD-10-CM | POA: Diagnosis not present

## 2023-02-07 DIAGNOSIS — R030 Elevated blood-pressure reading, without diagnosis of hypertension: Secondary | ICD-10-CM | POA: Diagnosis not present

## 2023-02-21 DIAGNOSIS — D509 Iron deficiency anemia, unspecified: Secondary | ICD-10-CM | POA: Diagnosis not present

## 2023-02-21 DIAGNOSIS — R42 Dizziness and giddiness: Secondary | ICD-10-CM | POA: Diagnosis not present

## 2023-02-21 DIAGNOSIS — I10 Essential (primary) hypertension: Secondary | ICD-10-CM | POA: Diagnosis not present

## 2023-02-21 DIAGNOSIS — N920 Excessive and frequent menstruation with regular cycle: Secondary | ICD-10-CM | POA: Diagnosis not present

## 2023-02-21 DIAGNOSIS — R531 Weakness: Secondary | ICD-10-CM | POA: Diagnosis not present

## 2023-02-21 DIAGNOSIS — R0602 Shortness of breath: Secondary | ICD-10-CM | POA: Diagnosis not present

## 2023-02-22 ENCOUNTER — Telehealth: Payer: Self-pay | Admitting: Obstetrics and Gynecology

## 2023-02-22 DIAGNOSIS — R42 Dizziness and giddiness: Secondary | ICD-10-CM | POA: Diagnosis not present

## 2023-02-22 NOTE — Transitions of Care (Post Inpatient/ED Visit) (Signed)
   02/22/2023  Name: Charlene Daniel MRN: [id number] DOB: August 23, 1991  Today's TOC FU Call Status: Today's TOC FU Call Status:: Unsuccessul Call (1st Attempt) Unsuccessful Call (1st Attempt) Date: 02/22/23  Attempted to reach the patient regarding the most recent Inpatient/ED visit.  Follow Up Plan: Additional outreach attempts will be made to reach the patient to complete the Transitions of Care (Post Inpatient/ED visit) call.   Kathi Der RN, BSN   [employer] - Managed Medicaid High Risk [phone]

## 2023-09-29 DIAGNOSIS — Z1331 Encounter for screening for depression: Secondary | ICD-10-CM | POA: Diagnosis not present

## 2023-09-29 DIAGNOSIS — Z113 Encounter for screening for infections with a predominantly sexual mode of transmission: Secondary | ICD-10-CM | POA: Diagnosis not present

## 2023-09-29 DIAGNOSIS — Z13220 Encounter for screening for lipoid disorders: Secondary | ICD-10-CM | POA: Diagnosis not present

## 2023-09-29 DIAGNOSIS — D509 Iron deficiency anemia, unspecified: Secondary | ICD-10-CM | POA: Diagnosis not present

## 2023-09-29 DIAGNOSIS — Z1159 Encounter for screening for other viral diseases: Secondary | ICD-10-CM | POA: Diagnosis not present

## 2023-09-29 DIAGNOSIS — Z3009 Encounter for other general counseling and advice on contraception: Secondary | ICD-10-CM | POA: Diagnosis not present

## 2023-09-29 DIAGNOSIS — Z3200 Encounter for pregnancy test, result unknown: Secondary | ICD-10-CM | POA: Diagnosis not present

## 2023-09-29 DIAGNOSIS — Z1389 Encounter for screening for other disorder: Secondary | ICD-10-CM | POA: Diagnosis not present

## 2023-09-29 DIAGNOSIS — R030 Elevated blood-pressure reading, without diagnosis of hypertension: Secondary | ICD-10-CM | POA: Diagnosis not present

## 2023-09-29 DIAGNOSIS — Z131 Encounter for screening for diabetes mellitus: Secondary | ICD-10-CM | POA: Diagnosis not present

## 2023-09-30 ENCOUNTER — Emergency Department
Admission: EM | Admit: 2023-09-30 | Discharge: 2023-09-30 | Disposition: A | Discharge status: Home / Self Care | Payer: Medicaid Other | Attending: Emergency Medicine | Admitting: Emergency Medicine

## 2023-09-30 ENCOUNTER — Other Ambulatory Visit: Payer: Self-pay

## 2023-09-30 DIAGNOSIS — D649 Anemia, unspecified: Secondary | ICD-10-CM | POA: Insufficient documentation

## 2023-09-30 LAB — CBC
HCT: 27.5 % — ABNORMAL LOW (ref 36.0–46.0)
Hemoglobin: 7.1 g/dL — ABNORMAL LOW (ref 12.0–15.0)
MCH: 17.4 pg — ABNORMAL LOW (ref 26.0–34.0)
MCHC: 25.8 g/dL — ABNORMAL LOW (ref 30.0–36.0)
MCV: 67.4 fL — ABNORMAL LOW (ref 80.0–100.0)
Platelets: 354 10*3/uL (ref 150–400)
RBC: 4.08 MIL/uL (ref 3.87–5.11)
RDW: 16.6 % — ABNORMAL HIGH (ref 11.5–15.5)
WBC: 4.6 10*3/uL (ref 4.0–10.5)
nRBC: 0 % (ref 0.0–0.2)

## 2023-09-30 LAB — COMPREHENSIVE METABOLIC PANEL
ALT: 9 U/L (ref 0–44)
AST: 18 U/L (ref 15–41)
Albumin: 4.5 g/dL (ref 3.5–5.0)
Alkaline Phosphatase: 54 U/L (ref 38–126)
Anion gap: 9 (ref 5–15)
BUN: 7 mg/dL (ref 6–20)
CO2: 21 mmol/L — ABNORMAL LOW (ref 22–32)
Calcium: 9.3 mg/dL (ref 8.9–10.3)
Chloride: 106 mmol/L (ref 98–111)
Creatinine, Ser: 0.53 mg/dL (ref 0.44–1.00)
GFR, Estimated: >60 mL/min (ref >60)
Glucose, Bld: 86 mg/dL (ref 70–99)
Potassium: 3.2 mmol/L — ABNORMAL LOW (ref 3.5–5.1)
Sodium: 136 mmol/L (ref 135–145)
Total Bilirubin: 0.5 mg/dL (ref 0.0–1.2)
Total Protein: 8.2 g/dL — ABNORMAL HIGH (ref 6.5–8.1)

## 2023-09-30 LAB — TYPE AND SCREEN
ABO/RH(D): O POS
Antibody Screen: NEGATIVE

## 2023-09-30 MED ORDER — FERROUS SULFATE 325 (65 FE) MG PO TBEC: 325.0000 mg | DELAYED_RELEASE_TABLET | Freq: Two times a day (BID) | ORAL | 2 refills | Status: DC

## 2023-09-30 NOTE — ED Provider Triage Note (Signed)
Emergency Medicine Provider Triage Evaluation Note  Charlene Daniel , a 33 y.o. female  was evaluated in triage.  Pt complains of low hgb. Labs drawn at [facility] yesterday. She was called today and advised to come to the ER for hemoglobin of 6 and transfusion. Reports heavy menstrual cycles.  Physical Exam  There were no vitals taken for this visit. Gen:   Awake, no distress   Resp:  Normal effort  MSK:   Moves extremities without difficulty  Other:    Medical Decision Making  Medically screening exam initiated at 4:28 PM.  Appropriate orders placed.  Charlene Daniel was informed that the remainder of the evaluation will be completed by another provider, this initial triage assessment does not replace that evaluation, and the importance of remaining in the ED until their evaluation is complete.    Chinita Pester, FNP 09/30/23 [phone]

## 2023-09-30 NOTE — ED Triage Notes (Signed)
Pt to ed from home via POV for abnormal labs. Pt went to [facility] where she went to be STD tested and they called her today with a HGB of 6 and told her to come to the ER. Pt is caox4, in no acute distress and ambulatory in triage.

## 2023-09-30 NOTE — ED Notes (Signed)
Patient up to first nurse desk asking for IV to be taken out. Patient reported she is not leaving but the IV "hurts"

## 2023-09-30 NOTE — ED Provider Notes (Signed)
[facility] Provider Note    Event Date/Time   First MD Initiated Contact with Patient 09/30/23 2035     (approximate)   History   abnormal labs   HPI  Charlene Daniel is a 33 y.o. female past medical history significant for anemia who presents to the emergency department with concern for anemia.  Patient had STI testing as an outpatient yesterday and was told that she was anemic and she needed to come to the emergency department for a transfusion.  Believes that she was here for an iron transfusion.  States that she was told her hemoglobin was 6.1.  Does endorse heavy menstrual cycles.  Received her first Depo shot yesterday.  Intermittently takes iron supplements but has not been taking it consistently.  Does not have a gynecologist.  Denies any blood in her stool or melena.  Denies any significant alcohol use or anticoagulation use.  Does endorse some mild fatigue.  Has an iron transfusion in the past.  Has never received a blood transfusion.     Physical Exam   Triage Vital Signs: ED Triage Vitals  Encounter Vitals Group     BP 09/30/23 1632 (!) 133/106     Systolic BP Percentile --      Diastolic BP Percentile --      Pulse Rate 09/30/23 1632 79     Resp 09/30/23 1632 16     Temp 09/30/23 1632 98.7 F (37.1 C)     Temp Source 09/30/23 1632 Oral     SpO2 09/30/23 1632 98 %     Weight 09/30/23 1631 110 lb 3.7 oz (50 kg)     Height 09/30/23 1631 5\' 2"  (1.575 m)     Head Circumference --      Peak Flow --      Pain Score 09/30/23 1631 0     Pain Loc --      Pain Education --      Exclude from Growth Chart --     Most recent vital signs: Vitals:   09/30/23 2045 09/30/23 2050  BP:    Pulse: 63 74  Resp:    Temp:    SpO2: 100% 100%    Physical Exam Constitutional:      Appearance: She is well-developed.  HENT:     Head: Atraumatic.  Eyes:     Comments: Pale conjunctiva  Cardiovascular:     Rate and Rhythm: Regular rhythm.   Pulmonary:     Effort: No respiratory distress.  Abdominal:     General: There is no distension.  Musculoskeletal:        General: Normal range of motion.     Cervical back: Normal range of motion.  Skin:    General: Skin is warm.  Neurological:     Mental Status: She is alert. Mental status is at baseline.      IMPRESSION / MDM / ASSESSMENT AND PLAN / ED COURSE  I reviewed the triage vital signs and the nursing notes.  Differential diagnosis including anemia, iron deficiency, electrolyte abnormality  On chart review patient was evaluated yesterday and had STI testing done   Labs (all labs ordered are listed, but only abnormal results are displayed) Labs interpreted as -    Labs Reviewed  COMPREHENSIVE METABOLIC PANEL - Abnormal; Notable for the following components:      Result Value   Potassium 3.2 (*)    CO2 21 (*)    Total Protein 8.2 (*)  All other components within normal limits  CBC - Abnormal; Notable for the following components:   Hemoglobin 7.1 (*)    HCT 27.5 (*)    MCV 67.4 (*)    MCH 17.4 (*)    MCHC 25.8 (*)    RDW 16.6 (*)    All other components within normal limits  POC URINE PREG, ED  TYPE AND SCREEN      Hemoglobin today is 7.1 from a baseline of 7.7, does appear microcytic.  Platelets within normal limits.  Likely secondary to menorrhagia.  Patient received Depo.  Offered patient blood transfusion given her symptomatic anemia however patient declined and she does not want to wait in the emergency department for 1.  Discussed that we do not usually do iron transfusion in the emergency department but that she could talk to her gynecologist/primary care physician about an iron transfusion.  Discussed that she should start taking iron supplements.  Given a prescription for iron supplements.  Given a referral to hematology information to establish care with gynecology.  Given return precautions for any ongoing or worsening  symptoms.   PROCEDURES:  Critical Care performed: No  Procedures  Patient's presentation is most consistent with acute complicated illness / injury requiring diagnostic workup.   MEDICATIONS ORDERED IN ED: Medications - No data to display  FINAL CLINICAL IMPRESSION(S) / ED DIAGNOSES   Final diagnoses:  Anemia, unspecified type     Rx / DC Orders   ED Discharge Orders          Ordered    Ambulatory referral to Hematology / Oncology       Comments: Your emergency department provider has referred you to see a hematology/oncology specialist. These are physicians who specialize in blood disorders and cancers, or findings concerning for cancer. You will receive a phone call from the [facility] to set up your appointment within 2 business days: [employer] operate Mon - Fri, 8:00 a.m. to 5:00 p.m.; closed for federally recognized holidays. Please be sure your phone is not set to block numbers during this time.   09/30/23 2103    ferrous sulfate 325 (65 FE) MG EC tablet  2 times daily        09/30/23 2103             Note:  This document was prepared using Dragon voice recognition software and may include unintentional dictation errors.   Corena Herter, MD 09/30/23 2132

## 2023-09-30 NOTE — Discharge Instructions (Signed)
You were seen in the emergency department for anemia.  Your hemoglobin was 7.1 today.  Concerned that you are having anemia from heavy menstrual cycles.  It is important that you take your iron supplements as prescribed every day.  You are offered a blood transfusion but you wanted to follow-up as an outpatient for blood transfusion and possible iron transfusion.  You are given a referral for hematology and information to follow-up with gynecology.  Return to the emergency department if you have any worsening symptoms of bleeding or symptoms of anemia.  Thank you for choosing [place] for your health care, it was my pleasure to care for you today!  Corena Herter, MD

## 2023-10-05 ENCOUNTER — Other Ambulatory Visit: Payer: Self-pay | Admitting: *Deleted

## 2023-10-06 ENCOUNTER — Inpatient Hospital Stay: Payer: Medicaid Other

## 2023-10-06 ENCOUNTER — Encounter: Payer: Self-pay | Admitting: Internal Medicine

## 2023-10-06 ENCOUNTER — Inpatient Hospital Stay: Payer: Medicaid Other | Attending: Internal Medicine | Admitting: Internal Medicine

## 2023-10-06 VITALS — BP 122/88 | HR 78 | Temp 98.3°F | Resp 18 | Wt 113.0 lb

## 2023-10-06 DIAGNOSIS — D649 Anemia, unspecified: Secondary | ICD-10-CM | POA: Insufficient documentation

## 2023-10-06 DIAGNOSIS — N920 Excessive and frequent menstruation with regular cycle: Secondary | ICD-10-CM | POA: Insufficient documentation

## 2023-10-06 DIAGNOSIS — D509 Iron deficiency anemia, unspecified: Secondary | ICD-10-CM | POA: Insufficient documentation

## 2023-10-06 DIAGNOSIS — Q780 Osteogenesis imperfecta: Secondary | ICD-10-CM

## 2023-10-06 LAB — ABO/RH: ABO/RH(D): O POS

## 2023-10-06 LAB — CBC WITH DIFFERENTIAL/PLATELET
Abs Immature Granulocytes: 0.01 10*3/uL (ref 0.00–0.07)
Basophils Absolute: 0 10*3/uL (ref 0.0–0.1)
Basophils Relative: 1 %
Eosinophils Absolute: 0 10*3/uL (ref 0.0–0.5)
Eosinophils Relative: 1 %
HCT: 28.2 % — ABNORMAL LOW (ref 36.0–46.0)
Hemoglobin: 7.5 g/dL — ABNORMAL LOW (ref 12.0–15.0)
Immature Granulocytes: 0 %
Lymphocytes Relative: 35 %
Lymphs Abs: 1.7 10*3/uL (ref 0.7–4.0)
MCH: 17.7 pg — ABNORMAL LOW (ref 26.0–34.0)
MCHC: 26.6 g/dL — ABNORMAL LOW (ref 30.0–36.0)
MCV: 66.5 fL — ABNORMAL LOW (ref 80.0–100.0)
Monocytes Absolute: 0.4 10*3/uL (ref 0.1–1.0)
Monocytes Relative: 8 %
Neutro Abs: 2.8 10*3/uL (ref 1.7–7.7)
Neutrophils Relative %: 55 %
Platelets: 362 10*3/uL (ref 150–400)
RBC: 4.24 MIL/uL (ref 3.87–5.11)
RDW: 17.9 % — ABNORMAL HIGH (ref 11.5–15.5)
Smear Review: ADEQUATE
WBC: 5 10*3/uL (ref 4.0–10.5)
nRBC: 0 % (ref 0.0–0.2)

## 2023-10-06 LAB — TYPE AND SCREEN
ABO/RH(D): O POS
Antibody Screen: NEGATIVE

## 2023-10-06 LAB — IRON AND TIBC
Iron: 14 ug/dL — ABNORMAL LOW (ref 28–170)
Saturation Ratios: 3 % — ABNORMAL LOW (ref 10.4–31.8)
TIBC: 519 ug/dL — ABNORMAL HIGH (ref 250–450)
UIBC: 505 ug/dL

## 2023-10-06 LAB — FERRITIN: Ferritin: 5 ng/mL — ABNORMAL LOW (ref 11–307)

## 2023-10-06 NOTE — Progress Notes (Signed)
 Patient has had Iron  infusions in the past, she actually went to [facility] about 6 months ago to see about getting an iron  infusion, but they told her that her levels were not low enough. She says that the iron  pills don't work and they upset her stomach. She has hardly any appetite. Her periods are so bad to were she feels like she can't move and she just wants to lie in bed.
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
## 2023-10-10 ENCOUNTER — Inpatient Hospital Stay: Payer: Medicaid Other

## 2023-10-10 VITALS — BP 122/80 | HR 82 | Temp 98.2°F | Resp 18

## 2023-10-10 DIAGNOSIS — N920 Excessive and frequent menstruation with regular cycle: Secondary | ICD-10-CM | POA: Diagnosis not present

## 2023-10-10 DIAGNOSIS — D649 Anemia, unspecified: Secondary | ICD-10-CM

## 2023-10-10 DIAGNOSIS — D509 Iron deficiency anemia, unspecified: Secondary | ICD-10-CM | POA: Diagnosis not present

## 2023-10-10 MED ORDER — IRON SUCROSE 20 MG/ML IV SOLN
200.0000 mg | Freq: Once | INTRAVENOUS | Status: AC
  Administered 2023-10-10: 200 mg via INTRAVENOUS

## 2023-10-10 NOTE — Patient Instructions (Signed)

## 2023-10-13 ENCOUNTER — Inpatient Hospital Stay: Payer: Medicaid Other

## 2023-10-13 VITALS — BP 124/80 | HR 70 | Temp 97.2°F | Resp 16

## 2023-10-13 DIAGNOSIS — D649 Anemia, unspecified: Secondary | ICD-10-CM

## 2023-10-13 DIAGNOSIS — D509 Iron deficiency anemia, unspecified: Secondary | ICD-10-CM | POA: Diagnosis not present

## 2023-10-13 DIAGNOSIS — N920 Excessive and frequent menstruation with regular cycle: Secondary | ICD-10-CM | POA: Diagnosis not present

## 2023-10-13 MED ORDER — SODIUM CHLORIDE 0.9% FLUSH
10.0000 mL | Freq: Once | INTRAVENOUS | Status: AC | PRN
  Administered 2023-10-13: 10 mL
  Filled 2023-10-13: qty 10

## 2023-10-13 MED ORDER — IRON SUCROSE 20 MG/ML IV SOLN
200.0000 mg | Freq: Once | INTRAVENOUS | Status: AC
  Administered 2023-10-13: 200 mg via INTRAVENOUS

## 2023-10-17 ENCOUNTER — Inpatient Hospital Stay: Payer: Medicaid Other

## 2023-10-17 VITALS — BP 135/86 | HR 64 | Temp 96.9°F | Resp 18

## 2023-10-17 DIAGNOSIS — N920 Excessive and frequent menstruation with regular cycle: Secondary | ICD-10-CM | POA: Diagnosis not present

## 2023-10-17 DIAGNOSIS — D649 Anemia, unspecified: Secondary | ICD-10-CM

## 2023-10-17 DIAGNOSIS — D509 Iron deficiency anemia, unspecified: Secondary | ICD-10-CM | POA: Diagnosis not present

## 2023-10-17 MED ORDER — IRON SUCROSE 20 MG/ML IV SOLN
200.0000 mg | Freq: Once | INTRAVENOUS | Status: AC
  Administered 2023-10-17: 200 mg via INTRAVENOUS

## 2023-10-17 NOTE — Patient Instructions (Signed)

## 2023-10-20 ENCOUNTER — Inpatient Hospital Stay: Payer: Medicaid Other

## 2023-10-24 ENCOUNTER — Inpatient Hospital Stay: Payer: Medicaid Other

## 2023-10-24 VITALS — BP 139/91 | HR 62 | Temp 97.2°F | Resp 18

## 2023-10-24 DIAGNOSIS — D649 Anemia, unspecified: Secondary | ICD-10-CM

## 2023-10-24 DIAGNOSIS — D509 Iron deficiency anemia, unspecified: Secondary | ICD-10-CM | POA: Diagnosis not present

## 2023-10-24 DIAGNOSIS — N920 Excessive and frequent menstruation with regular cycle: Secondary | ICD-10-CM | POA: Diagnosis not present

## 2023-10-24 MED ORDER — IRON SUCROSE 20 MG/ML IV SOLN
200.0000 mg | Freq: Once | INTRAVENOUS | Status: AC
  Administered 2023-10-24: 200 mg via INTRAVENOUS
  Filled 2023-10-24: qty 10

## 2023-10-27 ENCOUNTER — Inpatient Hospital Stay: Payer: Medicaid Other

## 2023-11-03 ENCOUNTER — Inpatient Hospital Stay: Payer: Medicaid Other | Attending: Internal Medicine

## 2023-11-03 VITALS — BP 129/89 | HR 71 | Temp 97.0°F | Resp 16

## 2023-11-03 DIAGNOSIS — D509 Iron deficiency anemia, unspecified: Secondary | ICD-10-CM | POA: Diagnosis not present

## 2023-11-03 DIAGNOSIS — N920 Excessive and frequent menstruation with regular cycle: Secondary | ICD-10-CM | POA: Diagnosis not present

## 2023-11-03 DIAGNOSIS — D649 Anemia, unspecified: Secondary | ICD-10-CM

## 2023-11-03 MED ORDER — IRON SUCROSE 20 MG/ML IV SOLN
200.0000 mg | Freq: Once | INTRAVENOUS | Status: AC
  Administered 2023-11-03: 200 mg via INTRAVENOUS
  Filled 2023-11-03: qty 10

## 2023-11-03 NOTE — Progress Notes (Signed)
Pt tolerated treatment without complaints.  VSS.  Pt refused 30 minute post observation.  Pt understands risks.   

## 2023-11-03 NOTE — Patient Instructions (Signed)

## 2023-12-17 ENCOUNTER — Emergency Department

## 2023-12-17 ENCOUNTER — Other Ambulatory Visit: Payer: Self-pay

## 2023-12-17 ENCOUNTER — Emergency Department
Admission: EM | Admit: 2023-12-17 | Discharge: 2023-12-17 | Disposition: A | Discharge status: Home / Self Care | Attending: Emergency Medicine | Admitting: Emergency Medicine

## 2023-12-17 DIAGNOSIS — I10 Essential (primary) hypertension: Secondary | ICD-10-CM | POA: Insufficient documentation

## 2023-12-17 DIAGNOSIS — R1031 Right lower quadrant pain: Secondary | ICD-10-CM

## 2023-12-17 DIAGNOSIS — D259 Leiomyoma of uterus, unspecified: Secondary | ICD-10-CM | POA: Insufficient documentation

## 2023-12-17 LAB — CBC
HCT: 42.6 % (ref 36.0–46.0)
Hemoglobin: 13.7 g/dL (ref 12.0–15.0)
MCH: 26.9 pg (ref 26.0–34.0)
MCHC: 32.2 g/dL (ref 30.0–36.0)
MCV: 83.5 fL (ref 80.0–100.0)
Platelets: 231 10*3/uL (ref 150–400)
RBC: 5.1 MIL/uL (ref 3.87–5.11)
RDW: 16 % — ABNORMAL HIGH (ref 11.5–15.5)
WBC: 9.2 10*3/uL (ref 4.0–10.5)
nRBC: 0 % (ref 0.0–0.2)

## 2023-12-17 LAB — COMPREHENSIVE METABOLIC PANEL WITH GFR
ALT: 14 U/L (ref 0–44)
AST: 16 U/L (ref 15–41)
Albumin: 3.9 g/dL (ref 3.5–5.0)
Alkaline Phosphatase: 67 U/L (ref 38–126)
Anion gap: 8 (ref 5–15)
BUN: 11 mg/dL (ref 6–20)
CO2: 22 mmol/L (ref 22–32)
Calcium: 9.3 mg/dL (ref 8.9–10.3)
Chloride: 103 mmol/L (ref 98–111)
Creatinine, Ser: 0.62 mg/dL (ref 0.44–1.00)
GFR, Estimated: >60 mL/min (ref >60)
Glucose, Bld: 89 mg/dL (ref 70–99)
Potassium: 4.1 mmol/L (ref 3.5–5.1)
Sodium: 133 mmol/L — ABNORMAL LOW (ref 135–145)
Total Bilirubin: 0.4 mg/dL (ref 0.0–1.2)
Total Protein: 7.7 g/dL (ref 6.5–8.1)

## 2023-12-17 LAB — URINALYSIS, ROUTINE W REFLEX MICROSCOPIC
Bilirubin Urine: NEGATIVE
Glucose, UA: NEGATIVE mg/dL
Ketones, ur: 20 mg/dL — AB
Nitrite: NEGATIVE
Protein, ur: 30 mg/dL — AB
Specific Gravity, Urine: 1.023 (ref 1.005–1.030)
pH: 5 (ref 5.0–8.0)

## 2023-12-17 LAB — LIPASE, BLOOD: Lipase: 25 U/L (ref 11–51)

## 2023-12-17 LAB — POC URINE PREG, ED: Preg Test, Ur: NEGATIVE

## 2023-12-17 MED ORDER — OXYCODONE-ACETAMINOPHEN 5-325 MG PO TABS: 1.0000 | ORAL_TABLET | ORAL | 0 refills | Status: DC | PRN

## 2023-12-17 MED ORDER — ACETAMINOPHEN 500 MG PO TABS
1000.0000 mg | ORAL_TABLET | Freq: Once | ORAL | Status: AC
Start: 2023-12-17 — End: 2023-12-17
  Administered 2023-12-17: 1000 mg via ORAL
  Filled 2023-12-17: qty 2

## 2023-12-17 MED ORDER — LACTATED RINGERS IV BOLUS
1000.0000 mL | Freq: Once | INTRAVENOUS | Status: AC
  Administered 2023-12-17: 1000 mL via INTRAVENOUS

## 2023-12-17 MED ORDER — IOHEXOL 300 MG/ML  SOLN
100.0000 mL | Freq: Once | INTRAMUSCULAR | Status: AC | PRN
  Administered 2023-12-17: 100 mL via INTRAVENOUS

## 2023-12-17 MED ORDER — KETOROLAC TROMETHAMINE 30 MG/ML IJ SOLN
30.0000 mg | Freq: Once | INTRAMUSCULAR | Status: AC
  Administered 2023-12-17: 30 mg via INTRAVENOUS
  Filled 2023-12-17: qty 1

## 2023-12-17 MED ORDER — ONDANSETRON HCL 4 MG/2ML IJ SOLN
4.0000 mg | Freq: Once | INTRAMUSCULAR | Status: AC
  Administered 2023-12-17: 4 mg via INTRAVENOUS
  Filled 2023-12-17: qty 2

## 2023-12-17 MED ORDER — MORPHINE SULFATE (PF) 4 MG/ML IV SOLN
4.0000 mg | Freq: Once | INTRAVENOUS | Status: AC
  Administered 2023-12-17: 4 mg via INTRAVENOUS
  Filled 2023-12-17: qty 1

## 2023-12-17 NOTE — ED Provider Notes (Signed)
 [facility] Provider Note   Event Date/Time   First MD Initiated Contact with Patient 12/17/23 1458     (approximate) History  Abdominal Pain and Leg Pain  HPI Charlene Daniel is a 33 y.o. female with a past medical history of osteogenesis imperfecta, hypertension, and anemia who presents complaining of right lower quadrant abdominal pain that radiates into her leg and has been present over the past 24 hours.  Patient states this pain is associated with nausea and vomiting over the last 3 days however the pain has not worsened until today.  Patient denies any abnormal vaginal discharge, diarrhea, constipation, recent travel, food out of the ordinary, or sick contacts ROS: Patient currently denies any vision changes, tinnitus, difficulty speaking, facial droop, sore throat, chest pain, shortness of breath, diarrhea, dysuria, or weakness/numbness/paresthesias in any extremity   Physical Exam  Triage Vital Signs: ED Triage Vitals  Encounter Vitals Group     BP 12/17/23 1211 (!) 142/109     Systolic BP Percentile --      Diastolic BP Percentile --      Pulse Rate 12/17/23 1211 82     Resp 12/17/23 1211 18     Temp 12/17/23 1211 98.5 F (36.9 C)     Temp Source 12/17/23 1211 Oral     SpO2 12/17/23 1211 100 %     Weight --      Height --      Head Circumference --      Peak Flow --      Pain Score 12/17/23 1214 7     Pain Loc --      Pain Education --      Exclude from Growth Chart --    Most recent vital signs: Vitals:   12/17/23 1900 12/17/23 2000  BP: 124/77 119/74  Pulse: 69 67  Resp: 16 18  Temp:    SpO2: 100% 100%   General: Awake, oriented x4. CV:  Good peripheral perfusion.  Resp:  Normal effort.  Abd:  No distention.  Right lower quadrant tenderness to palpation Other:  Middle-aged well-developed, well-nourished African-American female resting comfortably in no acute distress ED Results / Procedures / Treatments  Labs (all labs ordered  are listed, but only abnormal results are displayed) Labs Reviewed  COMPREHENSIVE METABOLIC PANEL WITH GFR - Abnormal; Notable for the following components:      Result Value   Sodium 133 (*)    All other components within normal limits  CBC - Abnormal; Notable for the following components:   RDW 16.0 (*)    All other components within normal limits  URINALYSIS, ROUTINE W REFLEX MICROSCOPIC - Abnormal; Notable for the following components:   Color, Urine YELLOW (*)    APPearance CLOUDY (*)    Hgb urine dipstick LARGE (*)    Ketones, ur 20 (*)    Protein, ur 30 (*)    Leukocytes,Ua SMALL (*)    Bacteria, UA RARE (*)    All other components within normal limits  LIPASE, BLOOD  POC URINE PREG, ED   RADIOLOGY ED MD interpretation: CT of the abdomen and pelvis independently interpreted shows no evidence of acute abnormalities however does show an incidentally found uterine fibroid -Agree with radiology assessment Official radiology report(s): No results found. PROCEDURES: Critical Care performed: No Procedures MEDICATIONS ORDERED IN ED: Medications  ketorolac  (TORADOL ) 30 MG/ML injection 30 mg (30 mg Intravenous Given 12/17/23 1538)  morphine  (PF) 4 MG/ML injection 4 mg (  4 mg Intravenous Given 12/17/23 1540)  lactated ringers  bolus 1,000 mL (0 mLs Intravenous Stopped 12/17/23 1719)  iohexol  (OMNIPAQUE ) 300 MG/ML solution 100 mL (100 mLs Intravenous Contrast Given 12/17/23 1558)  acetaminophen  (TYLENOL ) tablet 1,000 mg (1,000 mg Oral Given 12/17/23 1849)  ondansetron  (ZOFRAN ) injection 4 mg (4 mg Intravenous Given 12/17/23 1828)   IMPRESSION / MDM / ASSESSMENT AND PLAN / ED COURSE  I reviewed the triage vital signs and the nursing notes.                             The patient is on the cardiac monitor to evaluate for evidence of arrhythmia and/or significant heart rate changes. Patient's presentation is most consistent with acute presentation with potential threat to life or bodily  function. Patient's symptoms not typical for emergent causes of abdominal pain such as, but not limited to, appendicitis, abdominal aortic aneurysm, surgical biliary disease, pancreatitis, SBO, mesenteric ischemia, serious intra-abdominal bacterial illness. Presentation also not typical of gynecologic emergencies such as TOA, Ovarian Torsion, PID. Not Ectopic. Doubt atypical ACS.  Pt tolerating PO. Disposition: Patient will be discharged with strict return precautions and follow up with primary MD within 12-24 hours for further evaluation. Patient understands that this still may have an early presentation of an emergent medical condition such as appendicitis that will require a recheck.   FINAL CLINICAL IMPRESSION(S) / ED DIAGNOSES   Final diagnoses:  Right lower quadrant abdominal pain  Uterine leiomyoma, unspecified location   Rx / DC Orders   ED Discharge Orders          Ordered    oxyCODONE -acetaminophen  (PERCOCET) 5-325 MG tablet  Every 4 hours PRN        12/17/23 1952           Note:  This document was prepared using Dragon voice recognition software and may include unintentional dictation errors.   Trinady Milewski K, MD 12/19/23 [phone]

## 2023-12-17 NOTE — ED Triage Notes (Addendum)
 Pt to ED via POV from home. Pt reports abd pain with radiation to leg and N/V x3 days that has been worse. Pt reports feeling of not emptying bladder with urination. Denies loss of bowel or bladder.

## 2023-12-20 DIAGNOSIS — D252 Subserosal leiomyoma of uterus: Secondary | ICD-10-CM | POA: Diagnosis not present

## 2023-12-20 DIAGNOSIS — N920 Excessive and frequent menstruation with regular cycle: Secondary | ICD-10-CM | POA: Diagnosis not present

## 2023-12-20 DIAGNOSIS — R102 Pelvic and perineal pain: Secondary | ICD-10-CM | POA: Diagnosis not present

## 2023-12-23 DIAGNOSIS — Z01812 Encounter for preprocedural laboratory examination: Secondary | ICD-10-CM | POA: Diagnosis not present

## 2023-12-23 DIAGNOSIS — Z3042 Encounter for surveillance of injectable contraceptive: Secondary | ICD-10-CM | POA: Diagnosis not present

## 2023-12-27 ENCOUNTER — Inpatient Hospital Stay: Payer: Medicaid Other | Admitting: Internal Medicine

## 2023-12-27 ENCOUNTER — Inpatient Hospital Stay: Payer: Medicaid Other

## 2024-01-02 ENCOUNTER — Inpatient Hospital Stay (HOSPITAL_BASED_OUTPATIENT_CLINIC_OR_DEPARTMENT_OTHER): Admitting: Internal Medicine

## 2024-01-02 ENCOUNTER — Inpatient Hospital Stay: Attending: Internal Medicine

## 2024-01-02 ENCOUNTER — Inpatient Hospital Stay

## 2024-01-02 ENCOUNTER — Encounter: Payer: Self-pay | Admitting: Internal Medicine

## 2024-01-02 VITALS — BP 148/124 | HR 77 | Temp 98.6°F | Resp 20 | Wt 132.2 lb

## 2024-01-02 DIAGNOSIS — D649 Anemia, unspecified: Secondary | ICD-10-CM

## 2024-01-02 DIAGNOSIS — N920 Excessive and frequent menstruation with regular cycle: Secondary | ICD-10-CM | POA: Diagnosis not present

## 2024-01-02 DIAGNOSIS — D259 Leiomyoma of uterus, unspecified: Secondary | ICD-10-CM | POA: Insufficient documentation

## 2024-01-02 DIAGNOSIS — D509 Iron deficiency anemia, unspecified: Secondary | ICD-10-CM | POA: Insufficient documentation

## 2024-01-02 DIAGNOSIS — D500 Iron deficiency anemia secondary to blood loss (chronic): Secondary | ICD-10-CM

## 2024-01-02 LAB — CBC WITH DIFFERENTIAL (CANCER CENTER ONLY)
Abs Immature Granulocytes: 0.01 10*3/uL (ref 0.00–0.07)
Basophils Absolute: 0 10*3/uL (ref 0.0–0.1)
Basophils Relative: 1 %
Eosinophils Absolute: 0.1 10*3/uL (ref 0.0–0.5)
Eosinophils Relative: 2 %
HCT: 39.1 % (ref 36.0–46.0)
Hemoglobin: 12.4 g/dL (ref 12.0–15.0)
Immature Granulocytes: 0 %
Lymphocytes Relative: 25 %
Lymphs Abs: 1.4 10*3/uL (ref 0.7–4.0)
MCH: 27 pg (ref 26.0–34.0)
MCHC: 31.7 g/dL (ref 30.0–36.0)
MCV: 85.2 fL (ref 80.0–100.0)
Monocytes Absolute: 0.4 10*3/uL (ref 0.1–1.0)
Monocytes Relative: 7 %
Neutro Abs: 3.7 10*3/uL (ref 1.7–7.7)
Neutrophils Relative %: 65 %
Platelet Count: 472 10*3/uL — ABNORMAL HIGH (ref 150–400)
RBC: 4.59 MIL/uL (ref 3.87–5.11)
RDW: 14.4 % (ref 11.5–15.5)
WBC Count: 5.6 10*3/uL (ref 4.0–10.5)
nRBC: 0 % (ref 0.0–0.2)

## 2024-01-02 LAB — FERRITIN: Ferritin: 34 ng/mL (ref 11–307)

## 2024-01-02 LAB — IRON AND TIBC
Iron: 42 ug/dL (ref 28–170)
Saturation Ratios: 11 % (ref 10.4–31.8)
TIBC: 395 ug/dL (ref 250–450)
UIBC: 353 ug/dL

## 2024-01-02 NOTE — Progress Notes (Signed)
 [facility]  [phone] [phone]  ID: Charlene Daniel OB: September 06, 1990  MR#: [id number]  GUY#:[id number]  Patient Care Team: Center, [facility] as PCP - General Athea Haley, MD as Consulting Physician (Oncology)  Reason for visit-iron  deficiency anemia  HPI: Charlene Daniel is a 33 y.o. 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  Patient was previously seen by [facility] hematology and was treated with INFeD  in 2022.  She was lost to follow-up after.  09/30/2023-had routine follow-up with PCP and was found to have low hemoglobin and advised to ED.  In ED, hemoglobin was 7.1 and was discharged with referral to us . She reports heavy menstrual cycle every month.  Changes pads every 2-3 hours.  Associated with clots.  Recently started on Depo every 3 months. Cannot tolerate oral iron  due to nausea. Denies any bleeding in urine or stools.  Occasional ibuprofen use for dysmenorrhea. Denies any gastric bypass or weight loss surgery. Reports chronic shortness of breath on exertion and dizziness.  Completed IV Venofer  200 mg weekly x 5 in March 2025.  Follows with Dr. Orion Birks, OB.  History of uterine fibroid causing pain and heavy menstrual cycles.  On medroxyprogesterone which is helping.  Patient planning for hysterectomy.  Interval history Patient seen today as follow-up for iron  deficiency anemia, labs.   REVIEW OF SYSTEMS:   ROS  As per HPI. Otherwise, a complete review of systems is negative.  PAST MEDICAL HISTORY: Past Medical History:  Diagnosis Date   Anemia    Arm fracture, left 2016   Hypertension    Osteogenesis imperfecta    Type I    PAST SURGICAL HISTORY: Past Surgical History:  Procedure Laterality Date   arm surger      FAMILY HISTORY: History reviewed. No pertinent family history.  HEALTH MAINTENANCE: Social  History   Tobacco Use   Smoking status: Never   Smokeless tobacco: Never  Substance Use Topics   Alcohol use: No    Comment: socially   Drug use: No     No Known Allergies  Current Outpatient Medications  Medication Sig Dispense Refill   DEPO-PROVERA 150 MG/ML SUSY Inject into the muscle every 3 (three) months.     cephALEXin  (KEFLEX ) 500 MG capsule Take 1 capsule (500 mg total) by mouth 3 (three) times daily. (Patient not taking: Reported on 12/17/2023) 30 capsule 0   Ferrous Fumarate (HEMOCYTE - 106 MG FE) 324 (106 Fe) MG TABS tablet Take 1 tablet by mouth. (Patient not taking: Reported on 01/02/2024)     ferrous sulfate  325 (65 FE) MG EC tablet Take 1 tablet (325 mg total) by mouth 2 (two) times daily. (Patient not taking: Reported on 01/02/2024) 60 tablet 2   labetalol  (NORMODYNE ) 200 MG tablet Take 1 tablet (200 mg total) by mouth 2 (two) times daily. (Patient not taking: Reported on 07/29/2016) 60 tablet 5   oxyCODONE -acetaminophen  (PERCOCET) 5-325 MG tablet Take 1 tablet by mouth every 4 (four) hours as needed for severe pain (pain score 7-10). (Patient not taking: Reported on 01/02/2024) 8 tablet 0   No current facility-administered medications for this visit.    OBJECTIVE: Vitals:   01/02/24 1425  BP: (!) 148/124  Pulse: 77  Resp: 20  Temp: 98.6 F (37 C)  SpO2: 100%      Body mass index is 24.18 kg/m.      General: Well-developed, well-nourished, no acute distress.  Eyes: Pink conjunctiva, anicteric sclera. HEENT: Normocephalic, moist mucous membranes, clear oropharnyx. Lungs: Clear to auscultation bilaterally. Heart: Regular rate and rhythm. No rubs, murmurs, or gallops. Abdomen: Soft, nontender, nondistended. No organomegaly noted, normoactive bowel sounds. Musculoskeletal: No edema, cyanosis, or clubbing. Neuro: Alert, answering all questions appropriately. Cranial nerves grossly intact. Skin: No rashes or petechiae noted. Psych: Normal affect. Lymphatics: No  cervical, calvicular, axillary or inguinal LAD.   LAB RESULTS:  Lab Results  Component Value Date   NA 133 (L) 12/17/2023   K 4.1 12/17/2023   CL 103 12/17/2023   CO2 22 12/17/2023   GLUCOSE 89 12/17/2023   BUN 11 12/17/2023   CREATININE 0.62 12/17/2023   CALCIUM 9.3 12/17/2023   PROT 7.7 12/17/2023   ALBUMIN 3.9 12/17/2023   AST 16 12/17/2023   ALT 14 12/17/2023   ALKPHOS 67 12/17/2023   BILITOT 0.4 12/17/2023   GFRNONAA >60 12/17/2023   GFRAA >60 05/05/2018    Lab Results  Component Value Date   WBC 5.6 01/02/2024   NEUTROABS 3.7 01/02/2024   HGB 12.4 01/02/2024   HCT 39.1 01/02/2024   MCV 85.2 01/02/2024   PLT 472 (H) 01/02/2024    Lab Results  Component Value Date   TIBC 519 (H) 10/06/2023   FERRITIN 5 (L) 10/06/2023   IRONPCTSAT 3 (L) 10/06/2023     STUDIES: CT ABDOMEN PELVIS W CONTRAST Result Date: 12/17/2023 CLINICAL DATA:  RLQ abdominal pain EXAM: CT ABDOMEN AND PELVIS WITH CONTRAST TECHNIQUE: Multidetector CT imaging of the abdomen and pelvis was performed using the standard protocol following bolus administration of intravenous contrast. RADIATION DOSE REDUCTION: This exam was performed according to the departmental dose-optimization program which includes automated exposure control, adjustment of the mA and/or kV according to patient size and/or use of iterative reconstruction technique. CONTRAST:  OMNIPAQUE  IOHEXOL  300 MG/ML  SOLN COMPARISON:  None Available. FINDINGS: Lower chest: No pleural or pericardial effusion. Visualized lung bases clear. Hepatobiliary: No focal liver abnormality is seen. No gallstones, gallbladder wall thickening, or biliary dilatation. Pancreas: Unremarkable. No pancreatic ductal dilatation or surrounding inflammatory changes. Spleen: Normal in size without focal abnormality. Adrenals/Urinary Tract: Adrenal glands are unremarkable. Kidneys are normal, without renal calculi, focal lesion, or hydronephrosis. Bladder is  unremarkable. Stomach/Bowel: Stomach is decompressed, unremarkable. Small bowel is nondistended. Normal appendix. The colon is partially distended, unremarkable. Vascular/Lymphatic: No significant vascular findings are present. No enlarged abdominal or pelvic lymph nodes. Reproductive: Prominent enhancing 4.2 cm exophytic left uterine fundal fibroid. No adnexal mass. Other: No ascites.  No free air. Musculoskeletal: Lumbar spondylitic changes L4-S1. No acute findings. IMPRESSION: 1. No acute findings. Normal appendix. 2. 4.2 cm uterine fibroid. Electronically Signed   By: Nicoletta Barrier M.D.   On: 12/17/2023 18:12    ASSESSMENT AND PLAN:   Charlene Daniel is a 33 y.o. female with pmh of iron  deficiency anemia, hypertension, osteogenesis imperfecta referred to hematology for IDA.  # Iron  deficiency anemia -Likely secondary to heavy menstrual cycles from uterine fibroids.  Intolerant to oral iron  due to nausea. -Previously seen [facility] hematology in 2022 and was treated with INFeD .  Lost to follow-up after.  -sp IV Venofer  in March 2025. Hb improved from 7.5-12.8 today.  Iron  studies are pending.  Will hold off on iron  infusions today. . -Follows with Dr. Orion Birks, OB.  Patient is scheduled for laparoscopic hysterectomy on 02/14/2024.  After the procedure, she is less likely to need iron  infusions.  Will follow-up with her in 6  months just to recheck labs 1 more time then discharge.   No orders of the defined types were placed in this encounter.  Follow-up in 6 months for MD visit, labs, Venofer   Patient expressed understanding and was in agreement with this plan. She also understands that She can call clinic at any time with any questions, concerns, or complaints.   I spent a total of 25 minutes reviewing chart data, face-to-face evaluation with the patient, counseling and coordination of care as detailed above.  Yaminah Clayborn, MD   01/02/2024 2:44 PM

## 2024-01-16 ENCOUNTER — Encounter: Admitting: Obstetrics and Gynecology

## 2024-01-20 DIAGNOSIS — N920 Excessive and frequent menstruation with regular cycle: Secondary | ICD-10-CM | POA: Diagnosis not present

## 2024-01-25 DIAGNOSIS — Z124 Encounter for screening for malignant neoplasm of cervix: Secondary | ICD-10-CM | POA: Diagnosis not present

## 2024-01-25 DIAGNOSIS — N920 Excessive and frequent menstruation with regular cycle: Secondary | ICD-10-CM | POA: Diagnosis not present
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
## 2024-02-07 ENCOUNTER — Encounter
Admission: RE | Admit: 2024-02-07 | Discharge: 2024-02-07 | Disposition: A | Discharge status: Home / Self Care | Source: Ambulatory Visit | Attending: Obstetrics and Gynecology | Admitting: Obstetrics and Gynecology

## 2024-02-07 ENCOUNTER — Other Ambulatory Visit: Payer: Self-pay

## 2024-02-07 VITALS — Ht 62.0 in | Wt 130.0 lb

## 2024-02-07 DIAGNOSIS — Z01812 Encounter for preprocedural laboratory examination: Secondary | ICD-10-CM

## 2024-02-07 HISTORY — DX: Iron deficiency anemia, unspecified: D50.9

## 2024-02-07 HISTORY — DX: Leiomyoma of uterus, unspecified: D25.9

## 2024-02-07 NOTE — Patient Instructions (Addendum)
 Your procedure is scheduled on: Tuesday, June 17 Report to the Registration Desk on the 1st floor of the [facility] To find out your arrival time, please call [phone] between 1PM - 3PM on: Monday, June 16 If your arrival time is 6:00 am, do not arrive before that time as the [facility] entrance doors do not open until 6:00 am.  REMEMBER: Instructions that are not followed completely may result in serious medical risk, up to and including death; or upon the discretion of your surgeon and anesthesiologist your surgery may need to be rescheduled.  Do not eat food after midnight the night before surgery.  No gum chewing or hard candies.  You may however, drink CLEAR liquids up to 2 hours before you are scheduled to arrive for your surgery. Do not drink anything within 2 hours of your scheduled arrival time.  Clear liquids include: - water  - apple juice without pulp - gatorade (not RED colors) - black coffee or tea (Do NOT add milk or creamers to the coffee or tea) Do NOT drink anything that is not on this list.  One week prior to surgery: starting June 10 Stop Anti-inflammatories (NSAIDS) such as Advil, Aleve, Ibuprofen, Motrin, Naproxen, Naprosyn and Aspirin based products such as Excedrin, Goody's Powder, BC Powder. Stop ANY OVER THE COUNTER supplements until after surgery.  You may however, continue to take Tylenol  if needed for pain up until the day of surgery.  ON THE DAY OF SURGERY DO NOT TAKE ANY MEDICATIONS   No Alcohol for 24 hours before or after surgery.  No Smoking including e-cigarettes for 24 hours before surgery.  No chewable tobacco products for at least 6 hours before surgery.  No nicotine patches on the day of surgery.  Do not use any "recreational" drugs for at least a week (preferably 2 weeks) before your surgery.  Please be advised that the combination of cocaine and anesthesia may have negative outcomes, up to and including death. If you test  positive for cocaine, your surgery will be cancelled.  On the morning of surgery brush your teeth with toothpaste and water, you may rinse your mouth with mouthwash if you wish. Do not swallow any toothpaste or mouthwash.  Use CHG Soap as directed on instruction sheet.  Do not wear jewelry, make-up, hairpins, clips or nail polish.  For welded (permanent) jewelry: bracelets, anklets, waist bands, etc.  Please have this removed prior to surgery.  If it is not removed, there is a chance that hospital personnel will need to cut it off on the day of surgery.  Do not wear lotions, powders, or perfumes.   Do not shave body hair from the neck down 48 hours before surgery.  Contact lenses, hearing aids and dentures may not be worn into surgery.  Do not bring valuables to the hospital. [facility] is not responsible for any missing/lost belongings or valuables.   Notify your doctor if there is any change in your medical condition (cold, fever, infection).  Wear comfortable clothing (specific to your surgery type) to the hospital.  After surgery, you can help prevent lung complications by doing breathing exercises.  Take deep breaths and cough every 1-2 hours. Your doctor may order a device called an Incentive Spirometer to help you take deep breaths. When coughing or sneezing, hold a pillow firmly against your incision with both hands. This is called "splinting." Doing this helps protect your incision. It also decreases belly discomfort.  If you are  being discharged the day of surgery, you will not be allowed to drive home. You will need a responsible individual to drive you home and stay with you for 24 hours after surgery.   If you are taking public transportation, you will need to have a responsible individual with you.  Please call the Pre-admissions Testing Dept. at [phone] if you have any questions about these instructions.  Surgery Visitation Policy:  Patients having surgery  or a procedure may have two visitors.  Children under the age of 42 must have an adult with them who is not the patient.     Preparing for Surgery with CHLORHEXIDINE GLUCONATE (CHG) Soap  Chlorhexidine Gluconate (CHG) Soap  o An antiseptic cleaner that kills germs and bonds with the skin to continue killing germs even after washing  o Used for showering the night before surgery and morning of surgery  Before surgery, you can play an important role by reducing the number of germs on your skin.  CHG (Chlorhexidine gluconate) soap is an antiseptic cleanser which kills germs and bonds with the skin to continue killing germs even after washing.  Please do not use if you have an allergy to CHG or antibacterial soaps. If your skin becomes reddened/irritated stop using the CHG.  1. Shower the NIGHT BEFORE SURGERY and the MORNING OF SURGERY with CHG soap.  2. If you choose to wash your hair, wash your hair first as usual with your normal shampoo.  3. After shampooing, rinse your hair and body thoroughly to remove the shampoo.  4. Use CHG as you would any other liquid soap. You can apply CHG directly to the skin and wash gently with a scrungie or a clean washcloth.  5. Apply the CHG soap to your body only from the neck down. Do not use on open wounds or open sores. Avoid contact with your eyes, ears, mouth, and genitals (private parts). Wash face and genitals (private parts) with your normal soap.  6. Wash thoroughly, paying special attention to the area where your surgery will be performed.  7. Thoroughly rinse your body with warm water.  8. Do not shower/wash with your normal soap after using and rinsing off the CHG soap.  9. Pat yourself dry with a clean towel.  10. Wear clean pajamas to bed the night before surgery.  12. Place clean sheets on your bed the night of your first shower and do not sleep with pets.  13. Shower again with the CHG soap on the day of surgery prior to  arriving at the hospital.  14. Do not apply any deodorants/lotions/powders.  15. Please wear clean clothes to the hospital.

## 2024-02-08 ENCOUNTER — Encounter
Admission: RE | Admit: 2024-02-08 | Discharge: 2024-02-08 | Disposition: A | Discharge status: Home / Self Care | Source: Ambulatory Visit | Attending: Obstetrics and Gynecology | Admitting: Obstetrics and Gynecology

## 2024-02-08 DIAGNOSIS — Z01812 Encounter for preprocedural laboratory examination: Secondary | ICD-10-CM | POA: Diagnosis not present

## 2024-02-08 DIAGNOSIS — N920 Excessive and frequent menstruation with regular cycle: Secondary | ICD-10-CM | POA: Diagnosis not present

## 2024-02-08 LAB — CBC
HCT: 39 % (ref 36.0–46.0)
Hemoglobin: 13 g/dL (ref 12.0–15.0)
MCH: 28.8 pg (ref 26.0–34.0)
MCHC: 33.3 g/dL (ref 30.0–36.0)
MCV: 86.3 fL (ref 80.0–100.0)
Platelets: 270 10*3/uL (ref 150–400)
RBC: 4.52 MIL/uL (ref 3.87–5.11)
RDW: 13.3 % (ref 11.5–15.5)
WBC: 6.1 10*3/uL (ref 4.0–10.5)
nRBC: 0 % (ref 0.0–0.2)

## 2024-02-08 LAB — BASIC METABOLIC PANEL WITH GFR
Anion gap: 5 (ref 5–15)
BUN: 9 mg/dL (ref 6–20)
CO2: 27 mmol/L (ref 22–32)
Calcium: 9.4 mg/dL (ref 8.9–10.3)
Chloride: 108 mmol/L (ref 98–111)
Creatinine, Ser: 0.64 mg/dL (ref 0.44–1.00)
GFR, Estimated: >60 mL/min (ref >60)
Glucose, Bld: 89 mg/dL (ref 70–99)
Potassium: 3.8 mmol/L (ref 3.5–5.1)
Sodium: 140 mmol/L (ref 135–145)

## 2024-02-08 LAB — TYPE AND SCREEN
ABO/RH(D): O POS
Antibody Screen: NEGATIVE

## 2024-02-14 ENCOUNTER — Ambulatory Visit

## 2024-02-14 ENCOUNTER — Ambulatory Visit
Admission: RE | Admit: 2024-02-14 | Discharge: 2024-02-14 | Disposition: A | Discharge status: Home / Self Care | Attending: Obstetrics and Gynecology | Admitting: Obstetrics and Gynecology

## 2024-02-14 ENCOUNTER — Encounter
Admission: RE | Disposition: A | Discharge status: Home / Self Care | Payer: Self-pay | Source: Home / Self Care | Attending: Obstetrics and Gynecology

## 2024-02-14 ENCOUNTER — Other Ambulatory Visit: Payer: Self-pay

## 2024-02-14 ENCOUNTER — Encounter: Payer: Self-pay | Admitting: Obstetrics and Gynecology

## 2024-02-14 ENCOUNTER — Ambulatory Visit: Payer: Self-pay | Admitting: Urgent Care

## 2024-02-14 DIAGNOSIS — Z01812 Encounter for preprocedural laboratory examination: Secondary | ICD-10-CM

## 2024-02-14 DIAGNOSIS — N83201 Unspecified ovarian cyst, right side: Secondary | ICD-10-CM | POA: Diagnosis not present

## 2024-02-14 DIAGNOSIS — N83202 Unspecified ovarian cyst, left side: Secondary | ICD-10-CM | POA: Diagnosis not present

## 2024-02-14 DIAGNOSIS — N736 Female pelvic peritoneal adhesions (postinfective): Secondary | ICD-10-CM | POA: Diagnosis not present

## 2024-02-14 DIAGNOSIS — N939 Abnormal uterine and vaginal bleeding, unspecified: Secondary | ICD-10-CM | POA: Insufficient documentation

## 2024-02-14 DIAGNOSIS — R102 Pelvic and perineal pain: Secondary | ICD-10-CM | POA: Diagnosis present

## 2024-02-14 DIAGNOSIS — N920 Excessive and frequent menstruation with regular cycle: Secondary | ICD-10-CM

## 2024-02-14 DIAGNOSIS — D251 Intramural leiomyoma of uterus: Secondary | ICD-10-CM | POA: Insufficient documentation

## 2024-02-14 DIAGNOSIS — D252 Subserosal leiomyoma of uterus: Secondary | ICD-10-CM | POA: Diagnosis not present

## 2024-02-14 DIAGNOSIS — N80102 Endometriosis of left ovary, unspecified depth: Secondary | ICD-10-CM | POA: Diagnosis not present

## 2024-02-14 HISTORY — PX: ROBOTIC ASSISTED LAPAROSCOPIC OVARIAN CYSTECTOMY: SHX6081

## 2024-02-14 HISTORY — PX: HYSTERECTOMY, TOTAL, LAPAROSCOPIC, ROBOT-ASSISTED WITH SALPINGECTOMY: SHX7587

## 2024-02-14 HISTORY — PX: XI ROBOTIC ASSISTED OOPHORECTOMY: SHX6823

## 2024-02-14 HISTORY — PX: CYSTOSCOPY: SHX5120

## 2024-02-14 LAB — POCT PREGNANCY, URINE: Preg Test, Ur: NEGATIVE

## 2024-02-14 SURGERY — HYSTERECTOMY, TOTAL, LAPAROSCOPIC, ROBOT-ASSISTED WITH SALPINGECTOMY
Anesthesia: General | Site: Uterus | Laterality: Right

## 2024-02-14 MED ORDER — ROCURONIUM BROMIDE 100 MG/10ML IV SOLN
INTRAVENOUS | Status: DC | PRN
  Administered 2024-02-14: 10 mg via INTRAVENOUS
  Administered 2024-02-14: 50 mg via INTRAVENOUS
  Administered 2024-02-14 (×2): 20 mg via INTRAVENOUS

## 2024-02-14 MED ORDER — HYDROMORPHONE HCL 1 MG/ML IJ SOLN
INTRAMUSCULAR | Status: AC
  Filled 2024-02-14: qty 1

## 2024-02-14 MED ORDER — PROPOFOL 1000 MG/100ML IV EMUL
INTRAVENOUS | Status: AC
  Filled 2024-02-14: qty 100

## 2024-02-14 MED ORDER — POVIDONE-IODINE 10 % EX SWAB
2.0000 | Freq: Once | CUTANEOUS | Status: AC
Start: 2024-02-14 — End: 2024-02-14
  Administered 2024-02-14: 2 via TOPICAL

## 2024-02-14 MED ORDER — BUPIVACAINE HCL (PF) 0.5 % IJ SOLN
INTRAMUSCULAR | Status: AC
  Filled 2024-02-14: qty 30

## 2024-02-14 MED ORDER — HYDRALAZINE HCL 20 MG/ML IJ SOLN
INTRAMUSCULAR | Status: AC
  Filled 2024-02-14: qty 1

## 2024-02-14 MED ORDER — LACTATED RINGERS IV SOLN: INTRAVENOUS | Status: DC

## 2024-02-14 MED ORDER — CEFAZOLIN SODIUM-DEXTROSE 2-4 GM/100ML-% IV SOLN
2.0000 g | INTRAVENOUS | Status: AC
  Administered 2024-02-14: 2 g via INTRAVENOUS

## 2024-02-14 MED ORDER — ONDANSETRON HCL 4 MG/2ML IJ SOLN
INTRAMUSCULAR | Status: DC | PRN
  Administered 2024-02-14: 4 mg via INTRAVENOUS

## 2024-02-14 MED ORDER — SILVER NITRATE-POT NITRATE 75-25 % EX MISC
CUTANEOUS | Status: AC
  Filled 2024-02-14: qty 10

## 2024-02-14 MED ORDER — DEXAMETHASONE SODIUM PHOSPHATE 10 MG/ML IJ SOLN
INTRAMUSCULAR | Status: DC | PRN
  Administered 2024-02-14: 10 mg via INTRAVENOUS

## 2024-02-14 MED ORDER — HYDROMORPHONE HCL 1 MG/ML IJ SOLN
INTRAMUSCULAR | Status: DC | PRN
  Administered 2024-02-14 (×2): .5 mg via INTRAVENOUS

## 2024-02-14 MED ORDER — PROPOFOL 10 MG/ML IV BOLUS
INTRAVENOUS | Status: AC
  Filled 2024-02-14: qty 20

## 2024-02-14 MED ORDER — BUPIVACAINE HCL (PF) 0.5 % IJ SOLN
INTRAMUSCULAR | Status: DC | PRN
  Administered 2024-02-14: 10 mL

## 2024-02-14 MED ORDER — ONDANSETRON HCL 4 MG/2ML IJ SOLN
INTRAMUSCULAR | Status: AC
  Filled 2024-02-14: qty 2

## 2024-02-14 MED ORDER — ONDANSETRON HCL 4 MG PO TABS: 4.0000 mg | ORAL_TABLET | Freq: Every day | ORAL | 1 refills | Status: AC | PRN

## 2024-02-14 MED ORDER — FENTANYL CITRATE (PF) 100 MCG/2ML IJ SOLN
INTRAMUSCULAR | Status: AC
Start: 2024-02-14 — End: 2024-02-14
  Filled 2024-02-14: qty 2

## 2024-02-14 MED ORDER — PROPOFOL 500 MG/50ML IV EMUL
INTRAVENOUS | Status: DC | PRN
  Administered 2024-02-14: 150 ug/kg/min via INTRAVENOUS

## 2024-02-14 MED ORDER — ROCURONIUM BROMIDE 10 MG/ML (PF) SYRINGE
PREFILLED_SYRINGE | INTRAVENOUS | Status: AC
  Filled 2024-02-14: qty 10

## 2024-02-14 MED ORDER — ACETAMINOPHEN EXTRA STRENGTH 500 MG PO TABS
1000.0000 mg | ORAL_TABLET | Freq: Four times a day (QID) | ORAL | 0 refills | Status: AC
Start: 2024-02-14 — End: 2024-02-17

## 2024-02-14 MED ORDER — PROPOFOL 10 MG/ML IV BOLUS
INTRAVENOUS | Status: DC | PRN
  Administered 2024-02-14: 140 mg via INTRAVENOUS

## 2024-02-14 MED ORDER — ESMOLOL HCL 100 MG/10ML IV SOLN
INTRAVENOUS | Status: DC | PRN
  Administered 2024-02-14: 20 mg via INTRAVENOUS

## 2024-02-14 MED ORDER — DROPERIDOL 2.5 MG/ML IJ SOLN
INTRAMUSCULAR | Status: AC
  Filled 2024-02-14: qty 2

## 2024-02-14 MED ORDER — DROPERIDOL 2.5 MG/ML IJ SOLN
0.6250 mg | Freq: Once | INTRAMUSCULAR | Status: AC
  Administered 2024-02-14: 0.625 mg via INTRAVENOUS

## 2024-02-14 MED ORDER — OXYCODONE HCL 5 MG PO TABS: 5.0000 mg | ORAL_TABLET | ORAL | 0 refills | Status: AC | PRN

## 2024-02-14 MED ORDER — CELECOXIB 200 MG PO CAPS: 200.0000 mg | ORAL_CAPSULE | Freq: Two times a day (BID) | ORAL | 0 refills | Status: AC

## 2024-02-14 MED ORDER — OXYCODONE HCL 5 MG/5ML PO SOLN: 5.0000 mg | Freq: Once | ORAL | Status: AC | PRN

## 2024-02-14 MED ORDER — LACTATED RINGERS IV SOLN: INTRAVENOUS | Status: DC | PRN

## 2024-02-14 MED ORDER — ONDANSETRON 4 MG PO TBDP: 4.0000 mg | ORAL_TABLET | Freq: Three times a day (TID) | ORAL | 0 refills | Status: AC | PRN

## 2024-02-14 MED ORDER — GABAPENTIN 300 MG PO CAPS: 300.0000 mg | ORAL_CAPSULE | Freq: Every day | ORAL | 0 refills | Status: AC

## 2024-02-14 MED ORDER — GLYCOPYRROLATE 0.2 MG/ML IJ SOLN
INTRAMUSCULAR | Status: AC
  Filled 2024-02-14: qty 1

## 2024-02-14 MED ORDER — ORAL CARE MOUTH RINSE
15.0000 mL | Freq: Once | OROMUCOSAL | Status: AC
Start: 2024-02-14 — End: 2024-02-14

## 2024-02-14 MED ORDER — DOCUSATE SODIUM 100 MG PO CAPS: 100.0000 mg | ORAL_CAPSULE | Freq: Two times a day (BID) | ORAL | 0 refills | Status: AC

## 2024-02-14 MED ORDER — MIDAZOLAM HCL 2 MG/2ML IJ SOLN
INTRAMUSCULAR | Status: AC
  Filled 2024-02-14: qty 2

## 2024-02-14 MED ORDER — CEFAZOLIN SODIUM-DEXTROSE 2-4 GM/100ML-% IV SOLN
INTRAVENOUS | Status: AC
  Filled 2024-02-14: qty 100

## 2024-02-14 MED ORDER — HYDRALAZINE HCL 20 MG/ML IJ SOLN
INTRAMUSCULAR | Status: DC | PRN
  Administered 2024-02-14 (×2): 5 mg via INTRAVENOUS

## 2024-02-14 MED ORDER — DEXAMETHASONE SODIUM PHOSPHATE 10 MG/ML IJ SOLN
INTRAMUSCULAR | Status: AC
  Filled 2024-02-14: qty 1

## 2024-02-14 MED ORDER — LIDOCAINE HCL (CARDIAC) PF 100 MG/5ML IV SOSY
PREFILLED_SYRINGE | INTRAVENOUS | Status: DC | PRN
  Administered 2024-02-14: 80 mg via INTRAVENOUS

## 2024-02-14 MED ORDER — CHLORHEXIDINE GLUCONATE 0.12 % MT SOLN
OROMUCOSAL | Status: AC
  Filled 2024-02-14: qty 15

## 2024-02-14 MED ORDER — CHLORHEXIDINE GLUCONATE 0.12 % MT SOLN
15.0000 mL | Freq: Once | OROMUCOSAL | Status: AC
Start: 2024-02-14 — End: 2024-02-14
  Administered 2024-02-14: 15 mL via OROMUCOSAL

## 2024-02-14 MED ORDER — PHENYLEPHRINE 80 MCG/ML (10ML) SYRINGE FOR IV PUSH (FOR BLOOD PRESSURE SUPPORT)
PREFILLED_SYRINGE | INTRAVENOUS | Status: AC
  Filled 2024-02-14: qty 10

## 2024-02-14 MED ORDER — SUGAMMADEX SODIUM 200 MG/2ML IV SOLN
INTRAVENOUS | Status: DC | PRN
  Administered 2024-02-14: 150 mg via INTRAVENOUS

## 2024-02-14 MED ORDER — PROPOFOL 1000 MG/100ML IV EMUL
INTRAVENOUS | Status: AC
Start: 2024-02-14 — End: 2024-02-14
  Filled 2024-02-14: qty 100

## 2024-02-14 MED ORDER — ACETAMINOPHEN 500 MG PO TABS
ORAL_TABLET | ORAL | Status: AC
  Filled 2024-02-14: qty 2

## 2024-02-14 MED ORDER — OXYCODONE HCL 5 MG PO TABS
5.0000 mg | ORAL_TABLET | Freq: Once | ORAL | Status: AC | PRN
  Administered 2024-02-14: 5 mg via ORAL

## 2024-02-14 MED ORDER — CELECOXIB 200 MG PO CAPS
ORAL_CAPSULE | ORAL | Status: AC
  Filled 2024-02-14: qty 2

## 2024-02-14 MED ORDER — LIDOCAINE HCL (PF) 2 % IJ SOLN
INTRAMUSCULAR | Status: AC
  Filled 2024-02-14: qty 5

## 2024-02-14 MED ORDER — SODIUM CHLORIDE 0.9 % IR SOLN
Status: DC | PRN
  Administered 2024-02-14: 1000 mL

## 2024-02-14 MED ORDER — CELECOXIB 200 MG PO CAPS
400.0000 mg | ORAL_CAPSULE | ORAL | Status: AC
  Administered 2024-02-14: 400 mg via ORAL

## 2024-02-14 MED ORDER — MIDAZOLAM HCL 2 MG/2ML IJ SOLN
INTRAMUSCULAR | Status: DC | PRN
  Administered 2024-02-14: 2 mg via INTRAVENOUS

## 2024-02-14 MED ORDER — HEMOSTATIC AGENTS (NO CHARGE) OPTIME
TOPICAL | Status: DC | PRN
  Administered 2024-02-14: 1 via TOPICAL

## 2024-02-14 MED ORDER — ACETAMINOPHEN 500 MG PO TABS
1000.0000 mg | ORAL_TABLET | ORAL | Status: AC
  Administered 2024-02-14: 1000 mg via ORAL

## 2024-02-14 MED ORDER — OXYCODONE HCL 5 MG PO TABS
ORAL_TABLET | ORAL | Status: AC
Start: 2024-02-14 — End: 2024-02-14
  Filled 2024-02-14: qty 1

## 2024-02-14 MED ORDER — DEXMEDETOMIDINE HCL IN NACL 80 MCG/20ML IV SOLN
INTRAVENOUS | Status: DC | PRN
Start: 2024-02-14 — End: 2024-02-14
  Administered 2024-02-14: 12 ug via INTRAVENOUS
  Administered 2024-02-14 (×2): 4 ug via INTRAVENOUS

## 2024-02-14 MED ORDER — FENTANYL CITRATE (PF) 100 MCG/2ML IJ SOLN: 25.0000 ug | INTRAMUSCULAR | Status: DC | PRN

## 2024-02-14 MED ORDER — FENTANYL CITRATE (PF) 100 MCG/2ML IJ SOLN
INTRAMUSCULAR | Status: DC | PRN
  Administered 2024-02-14: 50 ug via INTRAVENOUS
  Administered 2024-02-14: 100 ug via INTRAVENOUS
  Administered 2024-02-14: 50 ug via INTRAVENOUS

## 2024-02-14 MED ORDER — 0.9 % SODIUM CHLORIDE (POUR BTL) OPTIME
TOPICAL | Status: DC | PRN
  Administered 2024-02-14: 500 mL

## 2024-02-14 SURGICAL SUPPLY — 50 items
BAG URINE DRAIN 2000ML AR STRL (UROLOGICAL SUPPLIES) ×5 IMPLANT
BARRIER ADHS 3X4 INTERCEED (GAUZE/BANDAGES/DRESSINGS) IMPLANT
BLADE SURG SZ11 CARB STEEL (BLADE) ×5 IMPLANT
CANNULA CAP OBTURATR AIRSEAL 8 (CAP) ×5 IMPLANT
CATH URTH 16FR FL 2W BLN LF (CATHETERS) ×5 IMPLANT
COVER TIP SHEARS 8 DVNC (MISCELLANEOUS) ×5 IMPLANT
DERMABOND ADVANCED .7 DNX12 (GAUZE/BANDAGES/DRESSINGS) ×5 IMPLANT
DRAPE ARM DVNC X/XI (DISPOSABLE) ×15 IMPLANT
DRAPE COLUMN DVNC XI (DISPOSABLE) ×5 IMPLANT
DRIVER NDL MEGA 8 DVNC XI (INSTRUMENTS) ×5 IMPLANT
DRIVER NDLE MEGA DVNC XI (INSTRUMENTS) ×4 IMPLANT
DRSG TEGADERM 2-3/8X2-3/4 SM (GAUZE/BANDAGES/DRESSINGS) IMPLANT
ELECTRODE REM PT RTRN 9FT ADLT (ELECTROSURGICAL) ×5 IMPLANT
FORCEPS BPLR FENES DVNC XI (FORCEP) ×5 IMPLANT
GAUZE 4X4 16PLY ~~LOC~~+RFID DBL (SPONGE) ×5 IMPLANT
GAUZE SPONGE 2X2 STRL 8-PLY (GAUZE/BANDAGES/DRESSINGS) IMPLANT
GLOVE BIO SURGEON STRL SZ7 (GLOVE) ×20 IMPLANT
GLOVE BIOGEL PI IND STRL 7.5 (GLOVE) ×20 IMPLANT
GOWN STRL REUS W/ TWL LRG LVL3 (GOWN DISPOSABLE) ×15 IMPLANT
IRRIGATION STRYKERFLOW (MISCELLANEOUS) IMPLANT
IRRIGATOR SUCT 8 DISP DVNC XI (IRRIGATION / IRRIGATOR) IMPLANT
IV LACTATED RINGERS 1000ML (IV SOLUTION) IMPLANT
IV NS 1000ML BAXH (IV SOLUTION) IMPLANT
KIT PINK PAD W/HEAD ARM REST (MISCELLANEOUS) ×5 IMPLANT
LABEL OR SOLS (LABEL) ×5 IMPLANT
MANIFOLD NEPTUNE II (INSTRUMENTS) ×5 IMPLANT
MANIPULATOR VCARE LG CRV RETR (MISCELLANEOUS) IMPLANT
MANIPULATOR VCARE SML CRV RETR (MISCELLANEOUS) IMPLANT
MANIPULATOR VCARE STD CRV RETR (MISCELLANEOUS) IMPLANT
NS IRRIG 500ML POUR BTL (IV SOLUTION) IMPLANT
OBTURATOR OPTICALSTD 8 DVNC (TROCAR) ×5 IMPLANT
OCCLUDER COLPOPNEUMO (BALLOONS) ×5 IMPLANT
PACK GYN LAPAROSCOPIC (MISCELLANEOUS) ×5 IMPLANT
PAD PREP OB/GYN DISP 24X41 (PERSONAL CARE ITEMS) ×5 IMPLANT
POWDER SURGICEL 3.0 GRAM (HEMOSTASIS) IMPLANT
SCISSORS MNPLR CVD DVNC XI (INSTRUMENTS) ×5 IMPLANT
SCRUB CHG 4% DYNA-HEX 4OZ (MISCELLANEOUS) ×5 IMPLANT
SEAL UNIV 5-12 XI (MISCELLANEOUS) ×10 IMPLANT
SEALER VESSEL EXT DVNC XI (MISCELLANEOUS) IMPLANT
SET CYSTO W/LG BORE CLAMP LF (SET/KITS/TRAYS/PACK) IMPLANT
SET TUBE FILTERED XL AIRSEAL (SET/KITS/TRAYS/PACK) ×5 IMPLANT
SOLUTION ELECTROSURG ANTI STCK (MISCELLANEOUS) ×5 IMPLANT
SOLUTION PREP PVP 2OZ (MISCELLANEOUS) ×5 IMPLANT
SURGILUBE 2OZ TUBE FLIPTOP (MISCELLANEOUS) ×5 IMPLANT
SUT STRATA PDS 0 30 CT-2.5 (SUTURE) ×5 IMPLANT
SUTURE MNCRL 4-0 27XMF (SUTURE) ×5 IMPLANT
SYR 50ML LL SCALE MARK (SYRINGE) ×5 IMPLANT
SYSTEM RETRIEVL 5MM INZII UNIV (BASKET) IMPLANT
TIP ENDOSCOPIC SURGICEL (TIP) IMPLANT
WATER STERILE IRR 500ML POUR (IV SOLUTION) ×5 IMPLANT

## 2024-02-14 NOTE — Op Note (Signed)
 Charlene Daniel Headings PROCEDURE DATE: 02/14/2024  PREOPERATIVE DIAGNOSIS: Abnormal uterine bleeding, chronic pelvic pain, symptomatic anemia POSTOPERATIVE DIAGNOSIS: The same PROCEDURE:  HYSTERECTOMY, TOTAL, LAPAROSCOPIC, ROBOT-ASSISTED WITH SALPINGECTOMY: 58571 (CPT)  CYSTOSCOPY: 52000 (CPT) Left ovarian cystectomy Right oophorectomy Adhesiolysis  SURGEON:  Dr. Prescilla Brod, MD ASSISTANT: Dr. Jeani Mill Anesthesiologist:  Anesthesiologist: Enrique Harvest, MD CRNA: Coley Davis, CRNA; Curvin Downing, CRNA Student Nurse Anesthetist: Lourena Royal, RN  INDICATIONS: 33 y.o. F  here for definitive surgical management secondary to the indications listed under preoperative diagnoses; please see preoperative note for further details.  Risks of surgery were discussed with the patient including but not limited to: bleeding which may require transfusion or reoperation; infection which may require antibiotics; injury to bowel, bladder, ureters or other surrounding organs; need for additional procedures; thromboembolic phenomenon, incisional problems and other postoperative/anesthesia complications. Written informed consent was obtained.    FINDINGS:    External genitalia, vaginal canal and cervix negative for lesions. Intraoperative findings revealed a normal upper abdomen including bowel, liver, diaphragmatic surfaces, stomach, and omentum.   All surfaces friable, including the bladder flap and the skin incisions.  Significant pelvic adhesions, filmy, but throughout.  Left sigmoid colon adhesed to the pelvic sidewall, left ovary adherent into the left ovarian fossa and to the left broad ligament at the level of the left ovarian artery.  Right ovary lightly adherent into the right ovarian fossa and the right broad ligament, as well as the sigmoid colon. Bilateral complex ovarian cysts, both appear to be endometriomas.  On the right, significant bleeding from the cyst bed  necessitated taking out the right ovary.  Large left subserosal fibroid.  The uterus was small  Bilateral tubes appeared normal.  Appendix in the pelvis but once extracted did appear within normal limits   ANESTHESIA:    General INTRAVENOUS FLUIDS:1300  ml ESTIMATED BLOOD LOSS:100 ml URINE OUTPUT: 300 ml  SPECIMENS: Uterus, cervix, bilateral fallopian tubes and right ovary/ Left ovarian cyst wall included with specimen. COMPLICATIONS: None immediate   RATLH/BS:  PROCEDURE IN DETAIL: After informed consent was obtained, the patient was taken to the operating room where general anesthesia was obtained without difficulty. The patient was positioned in the dorsal lithotomy position in Cordova stirrups and her arms were carefully tucked at her sides and the usual precautions were taken. Deep Trendelenburg (20-25 deg) was established to confirm that she does not shift on the table.  She was prepped and draped in normal sterile fashion.  Time-out was performed and a Foley catheter was placed into the bladder. A standard VCare uterine manipulator was then placed in the uterus without incident.  Preoperative prophylactic antibiotics were given through her iv.  After infiltration of local anesthetic at the proposed trocar sites, an 8 mm incision was created at the umbilicus, and an AirSeal 5mm was placed under direct visualization, after confirmation of OG tube working well. Pneumoperitoneum was created to a pressure of 15 mm Hg. The camera was placed and the abdomin surveyed, noting intact bowel below the site of entry. A survey of the pelvis and upper abdomen revealed the above findings. One right and one left lateral 8-mm robotic ports were placed under direct visualization.  The patient was placed in deepTrendelenburg and the bowel was displaced up into the upper abdomen. The robot was left side docked. The instruments were placed under direct visualization.   Adhesiolysis comprised small  portion of this case, but was significant clinically.  The posterior cul-de-sac had to be  excised out to visualize the posterior cervical goal edge, and the left ovary needed to be separated from the left pelvic sidewall to evaluate the ureters.  The ureters were identified bilaterally coursing outside of the operative field. Round ligaments were divided on each side with the EndoShears and the retroperitoneal space was opened bilaterally. The posterior leaflet of the broad was taken down to the level of the IP ligament. The anterior leaflet of the broad ligament was carefully taken down to the midline.  A bladder flap was created and the bladder was dissected down off the lower uterine segment and cervix using endoshears and electrocautery.   The Fallopian tubes were divided from the ovaries, and care taken to hemostatically transect the utero-ovarian ligament. The peritoneum was taken down to the level of the internal os, and the uterine arteries skeletonized. With strong cephalad pressure from the V-care, bipolar cautery was used to seal and transect the uterine arteries, and the pedicles allowed to fall away laterally.  A colpotomy was performed circumferentially along the V-Care ring with monopolar electrocautery and the cervix was incised from the vagina using the laparoscopic scissors. The specimen was removed through the vagina.  A pneumo balloon was placed in the vagina and the vaginal cuff was then closed in a running continuous fashion using the  0 V-Lock suture with careful attention to include the vaginal cuff angles, the uterosacral ligaments and the vaginal mucosa within the closure.  Attention was turned to the bilateral ovarian cyst.  On the right side, after removal of the cyst wall, this area was significantly friable and it was unable to be controlled with cautery and topical coagulant agent.  For this reason, and because the ovary was still adherent in the pelvic sidewall, the entire  ovary was removed.  Hemostasis was secured with intraabdominal pressure and review of all surgical sites. The intraperitoneal pressure was dropped, and all planes of dissection, vascular pedicles and the vaginal cuff were found to be oozing and friable.  Surgicel powder was placed over all surfaces, and the left ovary was wrapped in Interceed to prevent adhesions.    The ovary and the left cyst wall were placed into a 5 mm bag, and removed after the robot was undocked.  The robot was undocked.   Attention was turned to the bladder and cystoscopy showed vigorous bilateral ureteral jets.  No stitches were visualized in the bladder during cystoscopy.  The lateral trocars were removed under visualization.  The CO2 gas was released and several deep breaths given to remove any remaining CO2 from the peritoneal cavity.  The skin incisions were closed with 4-0 Monocryl subcuticular stitch and Dermabond.  Because the skin incisions did also bleed, pressure dressing was placed over the Dermabond.   Anesthesia was reversed without difficulty.  The patient tolerated the procedure well.  Sponge, lap and needle counts were correct x2.  The patient was taken to recovery room in excellent condition.

## 2024-02-14 NOTE — Discharge Instructions (Signed)
Discharge instructions after  robotically-assisted total laparoscopic hysterectomy   For the next three days, take ibuprofen and acetaminophen on a schedule, every 8 hours. You can take them together or you can intersperse them, and take one every four hours. I also gave you gabapentin for nighttime, to help you sleep and also to control pain. Take gabapentin medicines at night for at least the next 3 nights. You also have a narcotic, oxycodone, to take as needed if the above medicines don't help.  Postop constipation is a major cause of pain. Stay well hydrated, walk as you tolerate, and take over the counter senna as well as stool softeners if you need them.   Signs and Symptoms to Report Call our office at [phone] if you have any of the following.   Fever over 100.4 degrees or higher  Severe stomach pain not relieved with pain medications  Bright red bleeding that's heavier than a period that does not slow with rest  To go the bathroom a lot (frequency), you can't hold your urine (urgency), or it hurts when you empty your bladder (urinate)  Chest pain  Shortness of breath  Pain in the calves of your legs  Severe nausea and vomiting not relieved with anti-nausea medications  Signs of infection around your wounds, such as redness, hot to touch, swelling, green/yellow drainage (like pus), bad smelling discharge  Any concerns  What You Can Expect after Surgery  You may see some pink tinged, bloody fluid and bruising around the wound. This is normal.  You may notice shoulder and neck pain. This is caused by the gas used during surgery to expand your abdomen so your surgeon could get to the uterus easier.  You may have a sore throat because of the tube in your mouth during general anesthesia. This will go away in 2 to 3 days.  You may have some stomach cramps.  You may notice spotting on your panties.  You may have pain around the incision sites.   Activities after Your  Discharge Follow these guidelines to help speed your recovery at home:  Do the coughing and deep breathing as you did in the hospital for 2 weeks. Use the small blue breathing device, called the incentive spirometer for 2 weeks.  Don't drive if you are in pain or taking narcotic pain medicine. You may drive when you can safely slam on the brakes, turn the wheel forcefully, and rotate your torso comfortably. This is typically 1-2 weeks. Practice in a parking lot or side street prior to attempting to drive regularly.   Ask others to help with household chores for 4 weeks.  Do not lift anything heavier that 10 pounds for 4-6 weeks. This includes pets, children, and groceries.  Don't do strenuous activities, exercises, or sports like vacuuming, tennis, squash, etc. until your doctor says it is safe to do so. ---Maintain pelvic rest for 12 weeks. This means nothing in the vagina or rectum at all (no douching, tampons, intercourse) for 12 weeks.   Walk as you feel able. Rest often since it may take two or three weeks for your energy level to return to normal.   You may climb stairs  Avoid constipation:   -Eat fruits, vegetables, and whole grains. Eat small meals as your appetite will take time to return to normal.   -Drink 6 to 8 glasses of water each day unless your doctor has told you to limit your fluids.   -Use a laxative or   stool softener as needed if constipation becomes a problem. You may take Miralax, metamucil, Citrucil, Colace, Senekot, FiberCon, etc. If this does not relieve the constipation, try two tablespoons of Milk Of Magnesia every 8 hours until your bowels move.   You may shower. Gently wash the wounds with a mild soap and water. Pat dry.  Do not get in a hot tub, swimming pool, etc. for 6 weeks.  Do not use lotions, oils, powders on the wounds.  Do not douche, use tampons, or have sex until your doctor says it is okay.  Take your pain medicine when you need it. The medicine may not  work as well if the pain is bad.  Take the medicines you were taking before surgery. Other medications you will need are pain medications (Norco or Percocet) and nausea medications (Zofran).   Here is a helpful article from the website [employer], regarding constipation  Here are reasons why constipation occurs after surgery: 1) During the operation and in the recovery room, most people are given opioid pain medication, primarily through an IV, to treat moderate or severe pain. Intravenous opioids include morphine, Dilaudid and fentanyl. After surgery, patients are often prescribed opioid pain medication to take by mouth at home, including codeine, Vicodin, Norco, and Percocet. All of these medications cause constipation by slowing down the movement of your intestine. 2) Changes in your diet before surgery can be another culprit. It is common to get specific instructions to change how you normally eat or drink before your surgery, like only having liquids the day before or not having anything to eat or drink after midnight the night before surgery. For this reason, temporary dehydration may occur. This, along with not eating or only having liquids, means that you are getting less fiber than usual. Both these factors contribute to constipation. 3) Changes in your diet after surgery can also contribute to the problem. Although many people don't have dietary restrictions after operations, being under anesthesia can make you lose your appetite for several hours and maybe even days. Some people can even have nausea or vomiting. Not eating or drinking normally means that you are not getting enough fiber and you can get dehydrated, both leading to constipation. 4) Lying in a bed more than usual--which happens before, during and after surgery--combined with the medications and diet changes, all work together to slow down your colon and make your poop turn to rock.  No one likes to be constipated.  Let's face  it, it's not a pleasant feeling when you don't poop for days, then strain on the toilet to finally pass something large enough to cause damage. An ounce of prevention is worth a pound of cure, so: Assume you will be constipated. Plan and prepare accordingly. Post-surgery is one of those unique situations where the temporary use of laxatives can make a world of difference. Always consult with your doctor, and recognize that if you wait several days after surgery to take a laxative, the constipation might be too severe for these over-the-counter options. It is always important to discuss all medications you plan on taking with your doctor. Ask your doctor if you can start the laxative immediately after surgery. *  Here are go-to post-surgery laxatives: Senna: Senna is an herb that acts as a "stimulant laxative," meaning it increases the activity of the intestine to cause you to have a bowel movement. It comes in many forms, but senna pills are easy to take and are sold over the   counter at almost all pharmacies. Since opioid pain medications slow down the activity of the intestine, it makes sense to take a medication to help reverse that side effect. Long-term use of a stimulant laxative is not a good idea since it can make your colon "lazy" and not function properly; however, temporary use immediately after surgery is acceptable. In general, if you are able to eat a normal diet, taking senna soon after surgery works the best. Senna usually works within hours to produce a bowel movement, but this is less predictable when you are taking different medications after surgery. Try not to wait several days to start taking senna, as often it is too late by then. Just like with all medications or supplements, check with your doctor before starting new treatment.   Magnesium: Magnesium is an important mineral that our body needs. We get magnesium from some foods that we eat, especially foods that are high in fiber such  as broccoli, almonds and whole grains. There are also magnesium-based medications used to treat constipation including milk of magnesia (magnesium hydroxide), magnesium citrate and magnesium oxide. They work by drawing water into the intestine, putting it into the class of "osmotic" laxatives. Magnesium products in low doses appear to be safe, but if taken in very large doses, can lead to problems such as irregular heartbeat, low blood pressure and even death. It can also affect other medications you might be taking, therefore it is important to discuss using magnesium with your physician and pharmacist before initiating therapy. Most over-the-counter magnesium laxatives work very well to help with the constipation related to surgery, but sometimes they work too well and lead to diarrhea. Make sure you are somewhere with easy access to a bathroom, just in case.   Bisacodyl: Bisacodyl (generic name) is sold under brand names such as Dulcolax. Much like senna, it is a "stimulant laxative," meaning it makes your intestines move more quickly to push out the stool. This is another good choice to start taking as soon as your doctor says you can take a laxative after surgery. It comes in pill form and as a suppository, which is a good choice for people who cannot or are not allowed to swallow pills. Studies have shown that it works as a laxative, but like most of these medications, you should use this on a short-term basis only.   Enema: Enemas strike fear in many people, but FEAR NOT! It's nowhere near as big a deal as you may think. An enema is just a way to get some liquid into your rectum by placing a specially designed device through your anus. If you have never done one, it might seem like a painful, unpleasant, uncomfortable, complicated and lengthy procedure. But in reality, it's simple, takes just a few seconds and is highly effective. The small ready-made bottles you buy at the pharmacy are much easier than  the hose/large rubber container type. Those recommended positions illustrated in some instructions are generally not necessary to place the enema. It's very similar to the insertion of a tampon, requiring a slight squat. Some extra lubrication on the enema's tip (or on your anus) will make it a breeze. In certain cases, there is no substitute for a good enema. For example, if someone has not pooped for a few days, the beginning of the poop waiting to come out can become rock hard. Passing that hard stool can lead to much pain and problems like anal fissures. Inserting a little liquid to   break up the rock-hard stool will help make its passage much easier. Enemas come with different liquids. Most come with saline, but there are also mineral oil options. You can also use warm water in the reusable enema containers. They all work. But since saline can sometimes be irritating, so try a mineral oil or water enema instead.  Here are commonly recommended constipation medications that do not work well for post-surgery constipation: Docusate: Docusate (generic name) most commonly referred to as Colace (brand name) is not really a laxative, but is classified as a stool softener. Although this medication is commonly prescribed, it is not recommended for several reasons: 1) there is no good medical evidence that it works 2) even if it has an effect, which is very questionable, it is minimal and cannot combat the intestinal slowing caused by the opioid medications. Skip docusate to save money and space in your pillbox for something more effective.  PEG: Miralax (brand name) is basically a chemical called polyethylene glycol (PEG) and it has gained tremendous popularity as a laxative. This product is an "osmotic laxative" meaning it works by pulling water into the stool, making it softer. This is very similar to the action of natural fiber in foods and supplements. Therefore, the effect seen by this medication is not  immediate, causing a bowel movement in a day or more. Is this medication strong enough to battle the constipation related to having an operation? Maybe for some people not prone to constipation. But for most people, other laxatives are better to prevent constipation after surgery.  

## 2024-02-14 NOTE — Anesthesia Preprocedure Evaluation (Signed)
 Anesthesia Evaluation  Patient identified by MRN, date of birth, ID band Patient awake    Reviewed: Allergy & Precautions, NPO status , Patient's Chart, lab work & pertinent test results  Airway Mallampati: II  TM Distance: >3 FB Neck ROM: full    Dental  (+) Chipped   Pulmonary neg pulmonary ROS   Pulmonary exam normal        Cardiovascular negative cardio ROS Normal cardiovascular exam     Neuro/Psych negative neurological ROS  negative psych ROS   GI/Hepatic negative GI ROS, Neg liver ROS,,,  Endo/Other  negative endocrine ROS    Renal/GU      Musculoskeletal   Abdominal   Peds  Hematology negative hematology ROS (+)   Anesthesia Other Findings Past Medical History: No date: Anemia 2016: Gestational hypertension No date: Iron  deficiency anemia 03/2015: Left radial fracture No date: Osteogenesis imperfecta     Comment:  Type I No date: Uterine fibroid  Past Surgical History: 2016: FOREARM FRACTURE SURGERY; Left  BMI    Body Mass Index: 24.14 kg/m      Reproductive/Obstetrics negative OB ROS                             Anesthesia Physical Anesthesia Plan  ASA: 1  Anesthesia Plan: General ETT   Post-op Pain Management:    Induction: Intravenous  PONV Risk Score and Plan: 4 or greater and Ondansetron , Dexamethasone, Propofol infusion, TIVA and Midazolam  Airway Management Planned: Oral ETT  Additional Equipment:   Intra-op Plan:   Post-operative Plan: Extubation in OR  Informed Consent: I have reviewed the patients History and Physical, chart, labs and discussed the procedure including the risks, benefits and alternatives for the proposed anesthesia with the patient or authorized representative who has indicated his/her understanding and acceptance.     Dental Advisory Given  Plan Discussed with: Anesthesiologist, CRNA and Surgeon  Anesthesia Plan Comments:  (Patient consented for risks of anesthesia including but not limited to:  - adverse reactions to medications - damage to eyes, teeth, lips or other oral mucosa - nerve damage due to positioning  - sore throat or hoarseness - Damage to heart, brain, nerves, lungs, other parts of body or loss of life  Patient voiced understanding and assent.)       Anesthesia Quick Evaluation

## 2024-02-14 NOTE — Transfer of Care (Signed)
 Immediate Anesthesia Transfer of Care Note  Patient: Charlene Daniel  Procedure(s) Performed: HYSTERECTOMY, TOTAL, LAPAROSCOPIC, ROBOT-ASSISTED WITH SALPINGECTOMY (Bilateral) CYSTOSCOPY  Patient Location: PACU  Anesthesia Type:General  Level of Consciousness: drowsy and patient cooperative  Airway & Oxygen Therapy: Patient Spontanous Breathing and Patient connected to face mask oxygen  Post-op Assessment: Report given to RN and Post -op Vital signs reviewed and stable  Post vital signs: stable  Last Vitals:  Vitals Value Taken Time  BP    Temp    Pulse    Resp    SpO2      Last Pain:  Vitals:   02/14/24 0625  TempSrc: Temporal  PainSc: 0-No pain         Complications: No notable events documented.

## 2024-02-14 NOTE — Anesthesia Procedure Notes (Signed)
 Procedure Name: Intubation Date/Time: 02/14/2024 7:34 AM  Performed by: Lourena Royal, RNPre-anesthesia Checklist: Patient identified Patient Re-evaluated:Patient Re-evaluated prior to induction Oxygen Delivery Method: Circle system utilized Preoxygenation: Pre-oxygenation with 100% oxygen Induction Type: IV induction Ventilation: Mask ventilation without difficulty Laryngoscope Size: McGrath and 3 Grade View: Grade I Tube type: Oral Tube size: 7.0 mm Number of attempts: 1 Airway Equipment and Method: Stylet and Video-laryngoscopy Placement Confirmation: ETT inserted through vocal cords under direct vision, positive ETCO2 and breath sounds checked- equal and bilateral Secured at: 21 cm Tube secured with: Tape Dental Injury: Teeth and Oropharynx as per pre-operative assessment

## 2024-02-14 NOTE — Progress Notes (Signed)
 Patient awake, very sleepy, up into recliner, c/o's nausea, medicated per orders. Will continue to monitor.

## 2024-02-14 NOTE — Interval H&P Note (Signed)
 History and Physical Interval Note:  02/14/2024 6:41 AM  Charlene Daniel  has presented today for surgery, with the diagnosis of AUB-F and pelvic pain.  The various methods of treatment have been discussed with the patient and family. After consideration of risks, benefits and other options for treatment, the patient has consented to  Procedure(s): HYSTERECTOMY, TOTAL, LAPAROSCOPIC, ROBOT-ASSISTED WITH SALPINGECTOMY (Bilateral) CYSTOSCOPY (N/A) with possible ovarian cystectomy and possible excision of endometriosis as a surgical intervention.  The patient's history has been reviewed, patient examined, no change in status, stable for surgery.  I have reviewed the patient's chart and labs.  Questions were answered to the patient's satisfaction.     Prescilla Brod

## 2024-02-14 NOTE — Anesthesia Postprocedure Evaluation (Signed)
 Anesthesia Post Note  Patient: Charlene Daniel  Procedure(s) Performed: HYSTERECTOMY, TOTAL, LAPAROSCOPIC, ROBOT-ASSISTED WITH SALPINGECTOMY (Bilateral: Uterus) CYSTOSCOPY (Bladder) OOPHORECTOMY, ROBOT-ASSISTED (Right: Uterus) EXCISION, CYST, OVARY, ROBOT-ASSISTED, LAPAROSCOPIC (Left: Uterus)  Patient location during evaluation: PACU Anesthesia Type: General Level of consciousness: awake and alert Pain management: pain level controlled Vital Signs Assessment: post-procedure vital signs reviewed and stable Respiratory status: spontaneous breathing, nonlabored ventilation, respiratory function stable and patient connected to nasal cannula oxygen Cardiovascular status: blood pressure returned to baseline and stable Postop Assessment: no apparent nausea or vomiting Anesthetic complications: no  No notable events documented.   Last Vitals:  Vitals:   02/14/24 1203 02/14/24 1225  BP: 127/85 133/88  Pulse: 85 86  Resp:  15  Temp: 36.4 C 36.4 C  SpO2: 100% 100%    Last Pain:  Vitals:   02/14/24 1225  TempSrc: Temporal  PainSc: 5                  Enrique Harvest

## 2024-02-15 ENCOUNTER — Encounter: Payer: Self-pay | Admitting: Obstetrics and Gynecology

## 2024-02-15 LAB — SURGICAL PATHOLOGY

## 2024-06-20 ENCOUNTER — Encounter: Payer: Self-pay | Admitting: Internal Medicine

## 2024-07-03 ENCOUNTER — Encounter: Payer: Self-pay | Admitting: *Deleted

## 2024-07-03 ENCOUNTER — Other Ambulatory Visit: Payer: Self-pay | Admitting: *Deleted

## 2024-07-03 DIAGNOSIS — D500 Iron deficiency anemia secondary to blood loss (chronic): Secondary | ICD-10-CM

## 2024-07-04 ENCOUNTER — Inpatient Hospital Stay

## 2024-07-04 ENCOUNTER — Telehealth: Payer: Self-pay | Admitting: Oncology

## 2024-07-04 ENCOUNTER — Inpatient Hospital Stay: Admitting: Oncology

## 2024-07-04 NOTE — Telephone Encounter (Signed)
 Pt no-showed appts so I called the pt to see if she wants to rs, no answer so I left a vm to call us  back to rs.
